# Patient Record
Sex: Male | Born: 1945
Health system: Southern US, Community
[De-identification: ages and names within clinical notes are randomized; demographics above are authoritative.]

## PROBLEM LIST (undated history)

## (undated) DIAGNOSIS — C801 Malignant (primary) neoplasm, unspecified: Secondary | ICD-10-CM

## (undated) DIAGNOSIS — H409 Unspecified glaucoma: Secondary | ICD-10-CM

## (undated) DIAGNOSIS — L720 Epidermal cyst: Secondary | ICD-10-CM

## (undated) DIAGNOSIS — K219 Gastro-esophageal reflux disease without esophagitis: Secondary | ICD-10-CM

## (undated) HISTORY — PX: APPENDECTOMY: SHX54

## (undated) HISTORY — PX: OTHER SURGICAL HISTORY: SHX169

## (undated) HISTORY — PX: CYSTECTOMY: SUR359

## (undated) HISTORY — PX: CATARACT EXTRACTION: SUR2

---

## 2008-06-10 DIAGNOSIS — J302 Other seasonal allergic rhinitis: Secondary | ICD-10-CM | POA: Insufficient documentation

## 2008-06-10 DIAGNOSIS — Z77090 Contact with and (suspected) exposure to asbestos: Secondary | ICD-10-CM | POA: Insufficient documentation

## 2008-06-10 DIAGNOSIS — D219 Benign neoplasm of connective and other soft tissue, unspecified: Secondary | ICD-10-CM | POA: Insufficient documentation

## 2008-06-10 DIAGNOSIS — E7800 Pure hypercholesterolemia, unspecified: Secondary | ICD-10-CM | POA: Insufficient documentation

## 2008-06-10 DIAGNOSIS — Z91030 Bee allergy status: Secondary | ICD-10-CM | POA: Insufficient documentation

## 2008-06-10 DIAGNOSIS — M1990 Unspecified osteoarthritis, unspecified site: Secondary | ICD-10-CM | POA: Insufficient documentation

## 2011-07-26 ENCOUNTER — Other Ambulatory Visit: Payer: Self-pay | Admitting: Internal Medicine

## 2011-07-26 DIAGNOSIS — R109 Unspecified abdominal pain: Secondary | ICD-10-CM

## 2011-08-02 ENCOUNTER — Ambulatory Visit
Admission: RE | Admit: 2011-08-02 | Discharge: 2011-08-02 | Disposition: A | Discharge status: Home / Self Care | Payer: Managed Care, Other (non HMO) | Source: Ambulatory Visit | Attending: Internal Medicine | Admitting: Internal Medicine

## 2011-08-02 DIAGNOSIS — R109 Unspecified abdominal pain: Secondary | ICD-10-CM

## 2011-08-02 MED ORDER — IOHEXOL 300 MG/ML  SOLN
100.0000 mL | Freq: Once | INTRAMUSCULAR | Status: AC | PRN
  Administered 2011-08-02: 100 mL via INTRAVENOUS

## 2011-09-09 ENCOUNTER — Ambulatory Visit (INDEPENDENT_AMBULATORY_CARE_PROVIDER_SITE_OTHER): Payer: Managed Care, Other (non HMO) | Admitting: Surgery

## 2011-10-06 ENCOUNTER — Ambulatory Visit (INDEPENDENT_AMBULATORY_CARE_PROVIDER_SITE_OTHER): Payer: Managed Care, Other (non HMO) | Admitting: Surgery

## 2011-10-18 ENCOUNTER — Ambulatory Visit (INDEPENDENT_AMBULATORY_CARE_PROVIDER_SITE_OTHER): Payer: Managed Care, Other (non HMO) | Admitting: Surgery

## 2011-10-18 ENCOUNTER — Encounter (INDEPENDENT_AMBULATORY_CARE_PROVIDER_SITE_OTHER): Payer: Self-pay | Admitting: Surgery

## 2011-10-18 VITALS — BP 118/82 | HR 72 | Temp 98.0°F | Resp 16 | Ht 75.0 in | Wt 235.0 lb

## 2011-10-18 DIAGNOSIS — K4090 Unilateral inguinal hernia, without obstruction or gangrene, not specified as recurrent: Secondary | ICD-10-CM

## 2011-10-18 NOTE — Progress Notes (Signed)
General Surgery [facility]  Chief Complaint  Patient presents with  . New Evaluation    eval hernia - referral from Dr. Kirby Funk    HISTORY: Patient is a 66 year old black male referred by his primary care physician for evaluation and management of a left inguinal hernia. Patient has had intermittent left groin pain for approximately one year. This is aggravated by physical activity. He has never seen a bulge. His only prior abdominal surgery with perforated appendicitis is a 27 year old child. Patient underwent evaluation by his primary care physician. This included a CT scan of the abdomen and pelvis performed in July 2013. This showed a small left inguinal hernia with fatty tissue and soft tissue density within the inguinal canal with mild inflammatory changes. There was no bowel within the hernia sac. Patient is now referred for consideration for left inguinal hernia repair for persistent intermittent left inguinal pain.  No past medical history on file.   Current Outpatient Prescriptions  Medication Sig Dispense Refill  . Ascorbic Acid (VITAMIN C) 1000 MG tablet Take 1,000 mg by mouth daily.      Marland Kitchen aspirin 81 MG tablet Take 81 mg by mouth daily.      . Multiple Vitamins-Minerals (MULTIVITAMIN WITH MINERALS) tablet Take 1 tablet by mouth daily.         No Known Allergies   No family history on file.   History   Social History  . Marital Status: Married    Spouse Name: N/A    Number of Children: N/A  . Years of Education: N/A   Social History Main Topics  . Smoking status: Never Smoker   . Smokeless tobacco: None  . Alcohol Use: Yes  . Drug Use: No  . Sexually Active:    Other Topics Concern  . None   Social History Narrative  . None     REVIEW OF SYSTEMS - PERTINENT POSITIVES ONLY: Denies signs or symptoms of obstruction. No history of bulge. Intermittent left inguinal pain exacerbated by physical activity.  EXAM: Filed Vitals:   10/18/11 1324  BP: 118/82  Pulse: 72  Temp: 98 F (36.7 C)  Resp: 16    HEENT: normocephalic; pupils equal and reactive; sclerae clear; dentition good; mucous membranes moist NECK:  symmetric on extension; no palpable anterior or posterior cervical lymphadenopathy; no supraclavicular masses; no tenderness CHEST: clear to auscultation bilaterally without rales, rhonchi, or wheezes CARDIAC: regular rate and rhythm without significant murmur; peripheral pulses are full ABDOMEN: soft without distension; bowel sounds present; no mass; no hepatosplenomegaly; palpation in the right inguinal canal shows no evidence of hernia. Palpation in the left inguinal canal shows some soft tissue density at the external inguinal ring. This does not augment with cough or Valsalva. There is mild tenderness. In the right lower quadrant of the abdominal wall is a complex well-healed surgical wound. EXT:  non-tender without edema; no deformity NEURO: no gross focal deficits; no sign of tremor   LABORATORY RESULTS: See [facility] (CHL-Epic) for most recent results   RADIOLOGY RESULTS: See [facility] (CHL-Epic) for most recent results   IMPRESSION: #1 left inguinal hernia, documented by CT scan, symptomatic #2 personal history of prostate cancer, treated with radiation therapy #3 history of perforated appendicitis  PLAN: I discussed the above findings at length with the patient and his wife. I have provided them with written literature to review on open inguinal hernia repair with mesh. We have discussed the risk and benefits of  the procedure. We discussed the restrictions on his activities following surgery. We have discussed potential complications including infection and recurrence. They understand and would like to proceed in November of 2013.  The risks and benefits of the procedure have been discussed at length with the patient.  The patient understands the proposed procedure, potential  alternative treatments, and the course of recovery to be expected.  All of the patient's questions have been answered at this time.  The patient wishes to proceed with surgery.  Velora Heckler, MD, FACS General & Endocrine Surgery [facility]   Visit Diagnoses: 1. Inguinal hernia unilateral, non-recurrent, left     Primary Care Physician: Lillia Mountain, MD

## 2011-10-18 NOTE — Patient Instructions (Signed)
[facility]  HERNIA REPAIR POST OP INSTRUCTIONS  Always review your discharge instruction sheet given to you by the facility where your surgery was performed.  1. A  prescription for pain medication may be given to you upon discharge.  Take your pain medication as prescribed.  If narcotic pain medicine is not needed, then you may take acetaminophen (Tylenol) or ibuprofen (Advil) as needed. 2. Take your usually prescribed medications unless otherwise directed. 3. If you need a refill on your pain medication, please contact your pharmacy.  They will contact our office to request authorization. Prescriptions will not be filled after 5 pm daily or on weekends. 4. You should follow a light diet the first 24 hours after arrival home, such as soup and crackers or toast.  Be sure to include plenty of fluids daily.  Resume your normal diet the day after surgery. 5. Most patients will experience some swelling and bruising around the surgical site.  Ice packs and reclining will help.  Swelling and bruising can take several days to resolve.  6. It is common to experience some constipation if taking pain medication after surgery.  Increasing fluid intake and taking a stool softener (such as Colace) will usually help or prevent this problem from occurring.  A mild laxative (Milk of Magnesia or Miralax) should be taken according to package directions if there are no bowel movements after 48 hours. 7. Unless discharge instructions indicate otherwise, you may remove your bandages 24-48 hours after surgery, and you may shower at that time.  You may have steri-strips (small skin tapes) in place directly over the incision.  These strips should be left on the skin for 7-10 days.  If your surgeon used skin glue on the incision, you may shower in 24 hours.  The glue will flake off over the next 2-3 weeks.  Any sutures or staples will be removed at the office during your follow-up visit. 8. ACTIVITIES:  You  may resume regular (light) daily activities beginning the next day-such as daily self-care, walking, climbing stairs-gradually increasing activities as tolerated.  You may have sexual intercourse when it is comfortable.  Refrain from any heavy lifting or straining until approved by your doctor.  You may drive when you are no longer taking prescription pain medication, you can comfortably wear a seatbelt, and you can safely maneuver your car and apply brakes. 9. You should see your doctor in the office for a follow-up appointment approximately 2-3 weeks after your surgery.  Make sure that you call for this appointment within a day or two after you arrive home to insure a convenient appointment time.  WHEN TO CALL YOUR DOCTOR: 1. Fever greater than 101.0 2. Inability to urinate 3. Persistent nausea and/or vomiting 4. Extreme swelling or bruising 5. Continued bleeding from incision 6. Increased pain, redness, or drainage from the incision  The clinic staff is available to answer your questions during regular business hours.  Please don't hesitate to call and ask to speak to one of the nurses for clinical concerns.  If you have a medical emergency, go to the nearest emergency room or call 911.  A surgeon from [facility] is always on call for the hospital.   [facility] [address], [address], [place], [place]  [address]  [phone] ? [phone] ? FAX [phone] [email or web address]   

## 2011-11-25 ENCOUNTER — Encounter (HOSPITAL_COMMUNITY): Payer: Self-pay | Admitting: Pharmacy Technician

## 2011-11-30 ENCOUNTER — Other Ambulatory Visit (HOSPITAL_COMMUNITY): Payer: Managed Care, Other (non HMO)

## 2011-12-01 ENCOUNTER — Encounter (HOSPITAL_COMMUNITY): Payer: Self-pay

## 2011-12-01 ENCOUNTER — Encounter (HOSPITAL_COMMUNITY)
Admission: RE | Admit: 2011-12-01 | Discharge: 2011-12-01 | Disposition: A | Discharge status: Home / Self Care | Payer: Managed Care, Other (non HMO) | Source: Ambulatory Visit | Attending: Surgery | Admitting: Surgery

## 2011-12-01 DIAGNOSIS — C801 Malignant (primary) neoplasm, unspecified: Secondary | ICD-10-CM

## 2011-12-01 HISTORY — DX: Malignant (primary) neoplasm, unspecified: C80.1

## 2011-12-01 HISTORY — DX: Gastro-esophageal reflux disease without esophagitis: K21.9

## 2011-12-01 LAB — SURGICAL PCR SCREEN: MRSA, PCR: NEGATIVE

## 2011-12-01 NOTE — Patient Instructions (Signed)
[address]  12/01/2011   Your procedure is scheduled on: 11-11  -2013  Report to [facility] at    0530    AM  Call this number if you have problems the morning of surgery: [phone]  Or Presurgical Testing [phone](Cathlene Gardella)   Remember:   Do not eat food:After Midnight.    Take these medicines the morning of surgery with A SIP OF WATER: none    Do not wear jewelry, make-up or nail polish.  Do not wear lotions, powders, or perfumes. You may wear deodorant.  Do not shave 48 hours prior to surgery.(face and neck okay, no shaving of legs)  Do not bring valuables to the hospital.  Contacts, dentures or bridgework may not be worn into surgery.  Leave suitcase in the car. After surgery it may be brought to your room.  For patients admitted to the hospital, checkout time is 11:00 AM the day of discharge.   Patients discharged the day of surgery will not be allowed to drive home. Must have responsible person with you x 24 hours once discharged.  Name and phone number of your driver: Rajdeep Abbs, spouse [phone] cell  Special Instructions: CHG Shower Use Special Wash: see special instruction sheet..(avoid face and genitals)   Please read over the following fact sheets that you were given: MRSA Information.

## 2011-12-06 ENCOUNTER — Encounter (HOSPITAL_COMMUNITY): Payer: Self-pay | Admitting: Surgery

## 2011-12-06 ENCOUNTER — Ambulatory Visit (HOSPITAL_COMMUNITY)
Admission: RE | Admit: 2011-12-06 | Discharge: 2011-12-06 | Disposition: A | Discharge status: Home / Self Care | Payer: Managed Care, Other (non HMO) | Source: Ambulatory Visit | Attending: Surgery | Admitting: Surgery

## 2011-12-06 ENCOUNTER — Encounter (HOSPITAL_COMMUNITY)
Admission: RE | Disposition: A | Discharge status: Home / Self Care | Payer: Self-pay | Source: Ambulatory Visit | Attending: Surgery

## 2011-12-06 ENCOUNTER — Encounter (HOSPITAL_COMMUNITY): Payer: Self-pay | Admitting: Anesthesiology

## 2011-12-06 ENCOUNTER — Encounter (HOSPITAL_COMMUNITY): Payer: Self-pay

## 2011-12-06 ENCOUNTER — Ambulatory Visit (HOSPITAL_COMMUNITY): Payer: Managed Care, Other (non HMO) | Admitting: Anesthesiology

## 2011-12-06 DIAGNOSIS — Z859 Personal history of malignant neoplasm, unspecified: Secondary | ICD-10-CM | POA: Insufficient documentation

## 2011-12-06 DIAGNOSIS — K4090 Unilateral inguinal hernia, without obstruction or gangrene, not specified as recurrent: Secondary | ICD-10-CM | POA: Diagnosis present

## 2011-12-06 DIAGNOSIS — Z01812 Encounter for preprocedural laboratory examination: Secondary | ICD-10-CM | POA: Insufficient documentation

## 2011-12-06 DIAGNOSIS — Z923 Personal history of irradiation: Secondary | ICD-10-CM | POA: Insufficient documentation

## 2011-12-06 DIAGNOSIS — K219 Gastro-esophageal reflux disease without esophagitis: Secondary | ICD-10-CM | POA: Insufficient documentation

## 2011-12-06 DIAGNOSIS — Z7982 Long term (current) use of aspirin: Secondary | ICD-10-CM | POA: Insufficient documentation

## 2011-12-06 HISTORY — PX: INGUINAL HERNIA REPAIR: SHX194

## 2011-12-06 HISTORY — PX: INSERTION OF MESH: SHX5868

## 2011-12-06 HISTORY — PX: HERNIA REPAIR: SHX51

## 2011-12-06 SURGERY — REPAIR, HERNIA, INGUINAL, ADULT
Anesthesia: General | Site: Groin | Laterality: Left | Wound class: Clean

## 2011-12-06 MED ORDER — OXYCODONE HCL 5 MG PO TABS: 5.0000 mg | ORAL_TABLET | Freq: Once | ORAL | Status: DC | PRN

## 2011-12-06 MED ORDER — LIDOCAINE HCL (CARDIAC) 20 MG/ML IV SOLN
INTRAVENOUS | Status: DC | PRN
  Administered 2011-12-06: 100 mg via INTRAVENOUS

## 2011-12-06 MED ORDER — OXYCODONE HCL 5 MG/5ML PO SOLN
5.0000 mg | Freq: Once | ORAL | Status: DC | PRN
  Filled 2011-12-06: qty 5

## 2011-12-06 MED ORDER — ACETAMINOPHEN 10 MG/ML IV SOLN
INTRAVENOUS | Status: DC | PRN
  Administered 2011-12-06: 1000 mg via INTRAVENOUS

## 2011-12-06 MED ORDER — NEOSTIGMINE METHYLSULFATE 1 MG/ML IJ SOLN
INTRAMUSCULAR | Status: DC | PRN
  Administered 2011-12-06: 5 mg via INTRAVENOUS

## 2011-12-06 MED ORDER — MIDAZOLAM HCL 5 MG/5ML IJ SOLN
INTRAMUSCULAR | Status: DC | PRN
  Administered 2011-12-06: 2 mg via INTRAVENOUS

## 2011-12-06 MED ORDER — MEPERIDINE HCL 50 MG/ML IJ SOLN: 6.2500 mg | INTRAMUSCULAR | Status: DC | PRN

## 2011-12-06 MED ORDER — PROMETHAZINE HCL 25 MG/ML IJ SOLN: 6.2500 mg | INTRAMUSCULAR | Status: DC | PRN

## 2011-12-06 MED ORDER — HYDROCODONE-ACETAMINOPHEN 5-325 MG PO TABS
1.0000 | ORAL_TABLET | ORAL | Status: DC | PRN
  Administered 2011-12-06: 1 via ORAL
  Filled 2011-12-06: qty 1

## 2011-12-06 MED ORDER — HYDROMORPHONE HCL PF 1 MG/ML IJ SOLN
INTRAMUSCULAR | Status: AC
  Filled 2011-12-06: qty 1

## 2011-12-06 MED ORDER — ACETAMINOPHEN 10 MG/ML IV SOLN
INTRAVENOUS | Status: AC
  Filled 2011-12-06: qty 100

## 2011-12-06 MED ORDER — BUPIVACAINE HCL 0.5 % IJ SOLN
INTRAMUSCULAR | Status: AC
  Filled 2011-12-06: qty 1

## 2011-12-06 MED ORDER — CEFAZOLIN SODIUM-DEXTROSE 2-3 GM-% IV SOLR
2.0000 g | INTRAVENOUS | Status: AC
  Administered 2011-12-06: 2 g via INTRAVENOUS

## 2011-12-06 MED ORDER — LACTATED RINGERS IV SOLN
INTRAVENOUS | Status: DC | PRN
  Administered 2011-12-06 (×2): via INTRAVENOUS

## 2011-12-06 MED ORDER — DEXAMETHASONE SODIUM PHOSPHATE 10 MG/ML IJ SOLN
INTRAMUSCULAR | Status: DC | PRN
  Administered 2011-12-06: 10 mg via INTRAVENOUS

## 2011-12-06 MED ORDER — ACETAMINOPHEN 10 MG/ML IV SOLN: 1000.0000 mg | Freq: Once | INTRAVENOUS | Status: DC | PRN

## 2011-12-06 MED ORDER — GLYCOPYRROLATE 0.2 MG/ML IJ SOLN
INTRAMUSCULAR | Status: DC | PRN
  Administered 2011-12-06: .8 mg via INTRAVENOUS

## 2011-12-06 MED ORDER — ONDANSETRON HCL 4 MG/2ML IJ SOLN
INTRAMUSCULAR | Status: DC | PRN
  Administered 2011-12-06: 4 mg via INTRAVENOUS

## 2011-12-06 MED ORDER — CEFAZOLIN SODIUM-DEXTROSE 2-3 GM-% IV SOLR
INTRAVENOUS | Status: AC
  Filled 2011-12-06: qty 50

## 2011-12-06 MED ORDER — LACTATED RINGERS IV SOLN: INTRAVENOUS | Status: DC

## 2011-12-06 MED ORDER — FENTANYL CITRATE 0.05 MG/ML IJ SOLN
INTRAMUSCULAR | Status: DC | PRN
  Administered 2011-12-06: 50 ug via INTRAVENOUS
  Administered 2011-12-06: 150 ug via INTRAVENOUS

## 2011-12-06 MED ORDER — ROCURONIUM BROMIDE 100 MG/10ML IV SOLN
INTRAVENOUS | Status: DC | PRN
  Administered 2011-12-06: 50 mg via INTRAVENOUS

## 2011-12-06 MED ORDER — PROPOFOL 10 MG/ML IV BOLUS
INTRAVENOUS | Status: DC | PRN
  Administered 2011-12-06: 200 mg via INTRAVENOUS

## 2011-12-06 MED ORDER — BUPIVACAINE HCL (PF) 0.5 % IJ SOLN
INTRAMUSCULAR | Status: DC | PRN
  Administered 2011-12-06: 20 mL

## 2011-12-06 MED ORDER — HYDROMORPHONE HCL PF 1 MG/ML IJ SOLN
0.2500 mg | INTRAMUSCULAR | Status: DC | PRN
  Administered 2011-12-06: 0.5 mg via INTRAVENOUS

## 2011-12-06 MED ORDER — KETOROLAC TROMETHAMINE 30 MG/ML IJ SOLN
INTRAMUSCULAR | Status: DC | PRN
  Administered 2011-12-06: 30 mg via INTRAVENOUS

## 2011-12-06 SURGICAL SUPPLY — 35 items
APPLICATOR COTTON TIP 6IN STRL (MISCELLANEOUS) ×2 IMPLANT
BENZOIN TINCTURE PRP APPL 2/3 (GAUZE/BANDAGES/DRESSINGS) ×2 IMPLANT
BLADE HEX COATED 2.75 (ELECTRODE) ×2 IMPLANT
BLADE SURG 15 STRL LF DISP TIS (BLADE) ×1 IMPLANT
BLADE SURG 15 STRL SS (BLADE) ×1
BLADE SURG SZ10 CARB STEEL (BLADE) ×2 IMPLANT
CANISTER SUCTION 2500CC (MISCELLANEOUS) ×2 IMPLANT
CLOTH BEACON ORANGE TIMEOUT ST (SAFETY) ×2 IMPLANT
DECANTER SPIKE VIAL GLASS SM (MISCELLANEOUS) ×2 IMPLANT
DRAIN PENROSE 18X1/2 LTX STRL (DRAIN) ×2 IMPLANT
DRAPE LAPAROTOMY TRNSV 102X78 (DRAPE) ×2 IMPLANT
ELECT REM PT RETURN 9FT ADLT (ELECTROSURGICAL) ×2
ELECTRODE REM PT RTRN 9FT ADLT (ELECTROSURGICAL) ×1 IMPLANT
GLOVE BIOGEL PI IND STRL 7.0 (GLOVE) ×1 IMPLANT
GLOVE BIOGEL PI INDICATOR 7.0 (GLOVE) ×1
GLOVE SURG ORTHO 8.0 STRL STRW (GLOVE) ×2 IMPLANT
GOWN STRL NON-REIN LRG LVL3 (GOWN DISPOSABLE) ×4 IMPLANT
GOWN STRL REIN XL XLG (GOWN DISPOSABLE) ×4 IMPLANT
KIT BASIN OR (CUSTOM PROCEDURE TRAY) ×2 IMPLANT
MESH ULTRAPRO 3X6 7.6X15CM (Mesh General) ×2 IMPLANT
NEEDLE HYPO 25X1 1.5 SAFETY (NEEDLE) ×2 IMPLANT
NS IRRIG 1000ML POUR BTL (IV SOLUTION) ×2 IMPLANT
PACK BASIC VI WITH GOWN DISP (CUSTOM PROCEDURE TRAY) ×2 IMPLANT
PENCIL BUTTON HOLSTER BLD 10FT (ELECTRODE) ×2 IMPLANT
SPONGE GAUZE 4X4 12PLY (GAUZE/BANDAGES/DRESSINGS) ×2 IMPLANT
SPONGE LAP 4X18 X RAY DECT (DISPOSABLE) ×6 IMPLANT
STRIP CLOSURE SKIN 1/2X4 (GAUZE/BANDAGES/DRESSINGS) ×2 IMPLANT
SUT MNCRL AB 4-0 PS2 18 (SUTURE) ×2 IMPLANT
SUT NOVA NAB GS-22 2 0 T19 (SUTURE) ×4 IMPLANT
SUT SILK 2 0 SH (SUTURE) ×2 IMPLANT
SUT VIC AB 3-0 SH 18 (SUTURE) ×2 IMPLANT
SYR BULB IRRIGATION 50ML (SYRINGE) ×2 IMPLANT
SYR CONTROL 10ML LL (SYRINGE) ×2 IMPLANT
TOWEL OR 17X26 10 PK STRL BLUE (TOWEL DISPOSABLE) ×2 IMPLANT
YANKAUER SUCT BULB TIP 10FT TU (MISCELLANEOUS) ×2 IMPLANT

## 2011-12-06 NOTE — Op Note (Signed)
Inguinal Hernia, Open, Procedure Note  Pre-operative Diagnosis:  Left inguinal hernia, reducible  Post-operative Diagnosis: same  Surgeon:  Velora Heckler, MD, FACS  Anesthesia:  General  Indications: The patient presented with a left, reducible hernia.    Procedure Details  The patient was seen again in the Holding Room. The risks, benefits, complications, treatment options, and expected outcomes were discussed with the patient.  There was concurrence with the proposed plan, and informed consent was obtained. The site of surgery was properly noted/marked. The patient was taken to the Operating Room, identified by name, and the procedure verified as hernia repair. A Time Out was held and the above information confirmed.  The patient was placed in the supine position and underwent induction of anesthesia.  The lower abdomen and groin was prepped and draped in the usual strict aseptic fashion.  After ascertaining that an adequate level of anesthesia had been obtained, and incision is made in the groin with a #10 blade.  Dissection is carried through the subcutaneous tissues and hemostasis obtained with the electrocautery.  A Gelpi retractor is placed for exposure.  The external oblique fascia is incised in line with it's fibers and extended through the external inguinal ring.  The cord structures are dissected out of the inguinal canal and encircled with a Penrose drain.  The floor of the inguinal canal is dissected out.  The cord is explored and a small indirect sac is identified.  It is dissected out up to the level of the internal inguinal ring.  A high ligation of the sac is performed with a 2-0 Vicryl suture and the sac excised and discarded.  The floor of the inguinal canal is reconstructed with a sheet of mesh cut to the appropriate dimensions.  It is secured to the pubic tubercle with a 2-0 Novafil suture and along the inguinal ligament with a running 2-0 Novafil suture.  Mesh is split to  accommodate the cord structures.  The superior edge of the mesh is secured to the transversalis and internal oblique muscles with interrupted 2-0 Novafil sutures.  The tails of the mesh are overlapped lateral to the cord structures and secured to the inguinal ligament with interrupted 2-0 Novafil sutures to recreate the internal inguinal ring.  Cord structures are returned to the inguinal canal.  Local anesthetic is infiltrated throughout the field.  External oblique fascia is closed with interrupted 3-0 Vicryl sutures.  Subcutaneous tissues are closed with interrupted 3-0 Vicryl sutures.  Skin is anesthetized with local anesthetic, and the skin edges re-approximated with a running 4-0 Monocryl suture.  Wound is washed and dried and benzoin and steristrips are applied.  A gauze dressing is then applied.  Instrument, sponge, and needle counts were correct prior to closure and at the conclusion of the case.  Findings: Hernia as above  Estimated Blood Loss: Minimal         Specimens: None  Complications: None; patient tolerated the procedure well.         Disposition: PACU - hemodynamically stable.         Condition: stable   Velora Heckler, MD, FACS General & Endocrine Surgery [facility]

## 2011-12-06 NOTE — Transfer of Care (Signed)
Immediate Anesthesia Transfer of Care Note  Patient: Brandon Dyer  Procedure(s) Performed: Procedure(s) (LRB) with comments: HERNIA REPAIR INGUINAL ADULT (Left) - Repair Left Inguinal Hernia with Mesh INSERTION OF MESH (Left)  Patient Location: PACU  Anesthesia Type:General  Level of Consciousness: awake, alert  and oriented  Airway & Oxygen Therapy: Patient Spontanous Breathing and Patient connected to face mask oxygen  Post-op Assessment: Report given to PACU RN  Post vital signs: Reviewed  Complications: No apparent anesthesia complications

## 2011-12-06 NOTE — Anesthesia Postprocedure Evaluation (Signed)
Anesthesia Post Note  Patient: Brandon Dyer  Procedure(s) Performed: Procedure(s) (LRB): HERNIA REPAIR INGUINAL ADULT (Left) INSERTION OF MESH (Left)  Anesthesia type: General  Patient location: PACU  Post pain: Pain level controlled  Post assessment: Post-op Vital signs reviewed  Last Vitals: BP 135/89  Pulse 59  Temp 36.6 C (Oral)  Resp 14  SpO2 95%  Post vital signs: Reviewed  Level of consciousness: sedated  Complications: No apparent anesthesia complications

## 2011-12-06 NOTE — H&P (Signed)
Brandon Dyer is an 66 y.o. male.    General Surgery [facility]  Chief Complaint: LIH  HPI: Patient is a 66 year old black male referred by his primary care physician for evaluation and management of a left inguinal hernia. Patient has had intermittent left groin pain for approximately one year. This is aggravated by physical activity. He has never seen a bulge. His only prior abdominal surgery with perforated appendicitis is a 25 year old child. Patient underwent evaluation by his primary care physician. This included a CT scan of the abdomen and pelvis performed in July 2013. This showed a small left inguinal hernia with fatty tissue and soft tissue density within the inguinal canal with mild inflammatory changes. There was no bowel within the hernia sac. Patient is now referred for consideration for left inguinal hernia repair for persistent intermittent left inguinal pain.    Past Medical History  Diagnosis Date  . Cancer 12-01-11    dx. 2008-Radiation tx.  Marland Kitchen GERD (gastroesophageal reflux disease)     occ. -uses OTC med-PRILOSEC    Past Surgical History  Procedure Date  . Appendectomy   . Cataract extraction   . Cataract surgery     one eye    History reviewed. No pertinent family history. Social History:  reports that he has never smoked. He does not have any smokeless tobacco history on file. He reports that he drinks alcohol. He reports that he does not use illicit drugs.  Allergies: No Known Allergies  Medications Prior to Admission  Medication Sig Dispense Refill  . Ascorbic Acid (VITAMIN C) 1000 MG tablet Take 1,000 mg by mouth daily.      Marland Kitchen aspirin 81 MG tablet Take 81 mg by mouth daily.      . Multiple Vitamins-Minerals (ICAPS PO) Take 1 capsule by mouth daily.      . Multiple Vitamins-Minerals (MULTIVITAMIN WITH MINERALS) tablet Take 1 tablet by mouth daily.      Marland Kitchen VIAGRA 50 MG tablet Take 50 mg by mouth as needed. For erectile dysfunction        . VITAMIN E PO Take 1 tablet by mouth daily.        No results found for this or any previous visit (from the past 48 hour(s)). No results found.  Review of Systems  Constitutional: Negative.   HENT: Negative.   Eyes: Negative.   Respiratory: Negative.   Cardiovascular: Negative.   Gastrointestinal: Negative.   Genitourinary: Negative.   Musculoskeletal: Negative.   Skin: Negative.   Neurological: Negative.   Endo/Heme/Allergies: Negative.   Psychiatric/Behavioral: Negative.     Blood pressure 128/87, pulse 64, temperature 97.8 F (36.6 C), temperature source Oral, resp. rate 18, SpO2 98.00%. Physical Exam  Constitutional: He is oriented to person, place, and time. He appears well-developed and well-nourished. No distress.  HENT:  Head: Normocephalic and atraumatic.  Right Ear: External ear normal.  Left Ear: External ear normal.  Mouth/Throat: Oropharynx is clear and moist.  Eyes: Conjunctivae normal and EOM are normal. Pupils are equal, round, and reactive to light. No scleral icterus.  Neck: Normal range of motion. Neck supple. No tracheal deviation present. No thyromegaly present.  Cardiovascular: Normal rate, regular rhythm and normal heart sounds.   No murmur heard. Respiratory: Effort normal and breath sounds normal. He has no wheezes.  GI: Soft. Bowel sounds are normal. He exhibits no distension. There is no tenderness.  Genitourinary:       Small LIH, spontaneously reducible  Musculoskeletal:  Normal range of motion.  Neurological: He is alert and oriented to person, place, and time.  Skin: Skin is warm and dry.  Psychiatric: He has a normal mood and affect. His behavior is normal.     Assessment/Plan 1.  LIH, reducible  Plan repair today  The risks and benefits of the procedure have been discussed at length with the patient.  The patient understands the proposed procedure, potential alternative treatments, and the course of recovery to be expected.  All of  the patient's questions have been answered at this time.  The patient wishes to proceed with surgery.  Velora Heckler, MD, [facility] Office: [phone]    Jarryd Gratz M 12/06/2011, 7:21 AM

## 2011-12-06 NOTE — Preoperative (Signed)
Beta Blockers   Reason not to administer Beta Blockers:Not Applicable 

## 2011-12-06 NOTE — Anesthesia Preprocedure Evaluation (Addendum)
Anesthesia Evaluation  Patient identified by MRN, date of birth, ID band Patient awake    Reviewed: Allergy & Precautions, H&P , Patient's Chart, lab work & pertinent test results  Airway Mallampati: II TM Distance: >3 FB Neck ROM: Full    Dental  (+) Teeth Intact and Dental Advisory Given   Pulmonary neg pulmonary ROS,  breath sounds clear to auscultation  Pulmonary exam normal       Cardiovascular - angina- CAD and - Past MI Rhythm:Regular Rate:Normal     Neuro/Psych negative neurological ROS     GI/Hepatic Neg liver ROS, GERD-  Controlled,  Endo/Other  negative endocrine ROS  Renal/GU negative Renal ROS     Musculoskeletal negative musculoskeletal ROS (+)   Abdominal (+) - obese,   Peds  Hematology negative hematology ROS (+)   Anesthesia Other Findings   Reproductive/Obstetrics                         Anesthesia Physical Anesthesia Plan  ASA: II  Anesthesia Plan: General   Post-op Pain Management:    Induction: Intravenous  Airway Management Planned: Oral ETT  Additional Equipment:   Intra-op Plan:   Post-operative Plan: Extubation in OR  Informed Consent: I have reviewed the patients History and Physical, chart, labs and discussed the procedure including the risks, benefits and alternatives for the proposed anesthesia with the patient or authorized representative who has indicated his/her understanding and acceptance.   Dental advisory given  Plan Discussed with: CRNA  Anesthesia Plan Comments:        Anesthesia Quick Evaluation

## 2011-12-07 ENCOUNTER — Encounter (HOSPITAL_COMMUNITY): Payer: Self-pay | Admitting: Surgery

## 2011-12-20 ENCOUNTER — Encounter (INDEPENDENT_AMBULATORY_CARE_PROVIDER_SITE_OTHER): Payer: Self-pay | Admitting: Surgery

## 2011-12-20 ENCOUNTER — Ambulatory Visit (INDEPENDENT_AMBULATORY_CARE_PROVIDER_SITE_OTHER): Payer: Managed Care, Other (non HMO) | Admitting: Surgery

## 2011-12-20 VITALS — BP 100/68 | HR 149 | Temp 97.8°F | Ht 75.0 in | Wt 241.6 lb

## 2011-12-20 DIAGNOSIS — K4090 Unilateral inguinal hernia, without obstruction or gangrene, not specified as recurrent: Secondary | ICD-10-CM

## 2011-12-20 NOTE — Progress Notes (Signed)
General Surgery [facility]  Visit Diagnoses: 1. Inguinal hernia unilateral, non-recurrent, left     HISTORY: Patient is a 66 year old black male who underwent left inguinal hernia repair with mesh on 12/06/2011. Postoperative course has been uneventful. He has no complaints.  However on presentation in the office today for his postoperative visit, he is noted to be tachycardic with a heart rate of approximately 150 beats per minute. Patient does complain of being slightly lightheaded this morning.  EXAM: Surgical wound is well-healed. Mild soft tissue swelling. No sign of infection. With cough and Valsalva there is no sign of recurrence.  Auscultation of the chest shows the lung fields to be clear. Cardiac exam shows a significant tachycardia with a regular rhythm and no significant murmur  IMPRESSION: #1 status post left inguinal hernia repair with mesh, doing well #2 tachycardia, mildly symptomatic, suspect atrial flutter  PLAN: Patient will return for followup wound check in 6-8 weeks. Instructions are given for wound care and for restrictions on activities.  We will contact the patient's primary care physician and make arrangements for evaluation today to include an EKG.  Velora Heckler, MD, FACS General & Endocrine Surgery [facility]

## 2011-12-20 NOTE — Patient Instructions (Signed)
  COCOA BUTTER & VITAMIN E CREAM  (Palmer's or other brand)  Apply cocoa butter/vitamin E cream to your incision 2 - 3 times daily.  Massage cream into incision for one minute with each application.  Use sunscreen (50 SPF or higher) for first 6 months after surgery if area is exposed to sun.  You may substitute Mederma or other scar reducing creams as desired.   

## 2012-02-07 ENCOUNTER — Encounter (INDEPENDENT_AMBULATORY_CARE_PROVIDER_SITE_OTHER): Payer: Self-pay | Admitting: Surgery

## 2012-02-07 ENCOUNTER — Ambulatory Visit (INDEPENDENT_AMBULATORY_CARE_PROVIDER_SITE_OTHER): Payer: Managed Care, Other (non HMO) | Admitting: Surgery

## 2012-02-07 VITALS — BP 118/76 | HR 64 | Temp 97.0°F | Resp 16 | Ht 75.0 in | Wt 241.8 lb

## 2012-02-07 DIAGNOSIS — K4090 Unilateral inguinal hernia, without obstruction or gangrene, not specified as recurrent: Secondary | ICD-10-CM

## 2012-02-07 NOTE — Progress Notes (Signed)
General Surgery [facility]  Visit Diagnoses: 1. Inguinal hernia unilateral, non-recurrent, left     HISTORY: Patient returns for final postoperative visit having undergone left inguinal hernia repair with mesh in November 2013. Patient denies any significant problems. He does have intermittent episodes of constipation.  EXAM: Left inguinal incision is well-healed. Minimal soft tissue swelling. Palpation in the inguinal canal with cough and Valsalva shows no sign of recurrence.  IMPRESSION: Status post left inguinal hernia repair with mesh  PLAN: The patient may resume normal activity without restriction. I have advised him to use MiraLAX on occasion for constipation. He will continue to apply topical creams to his incision. He will return to see me as needed.  Velora Heckler, MD, FACS General & Endocrine Surgery [facility]

## 2012-02-07 NOTE — Patient Instructions (Signed)
Miralax for constipation as needed.   COCOA BUTTER & VITAMIN E CREAM  (Palmer's or other brand)  Apply cocoa butter/vitamin E cream to your incision 2 - 3 times daily.  Massage cream into incision for one minute with each application.  Use sunscreen (50 SPF or higher) for first 6 months after surgery if area is exposed to sun.  You may substitute Mederma or other scar reducing creams as desired.

## 2012-07-03 DIAGNOSIS — H259 Unspecified age-related cataract: Secondary | ICD-10-CM | POA: Diagnosis not present

## 2012-08-07 DIAGNOSIS — Z23 Encounter for immunization: Secondary | ICD-10-CM | POA: Diagnosis not present

## 2012-08-07 DIAGNOSIS — Z1331 Encounter for screening for depression: Secondary | ICD-10-CM | POA: Diagnosis not present

## 2012-08-07 DIAGNOSIS — Z0000 Encounter for general adult medical examination without abnormal findings: Secondary | ICD-10-CM | POA: Diagnosis not present

## 2013-04-30 DIAGNOSIS — N529 Male erectile dysfunction, unspecified: Secondary | ICD-10-CM | POA: Diagnosis not present

## 2013-04-30 DIAGNOSIS — Z8546 Personal history of malignant neoplasm of prostate: Secondary | ICD-10-CM | POA: Diagnosis not present

## 2013-04-30 DIAGNOSIS — R351 Nocturia: Secondary | ICD-10-CM | POA: Diagnosis not present

## 2013-07-30 DIAGNOSIS — Z8546 Personal history of malignant neoplasm of prostate: Secondary | ICD-10-CM | POA: Diagnosis not present

## 2013-08-13 DIAGNOSIS — Z0000 Encounter for general adult medical examination without abnormal findings: Secondary | ICD-10-CM | POA: Diagnosis not present

## 2013-08-13 DIAGNOSIS — Z23 Encounter for immunization: Secondary | ICD-10-CM | POA: Diagnosis not present

## 2014-03-28 ENCOUNTER — Telehealth: Payer: Self-pay | Admitting: Internal Medicine

## 2014-03-28 NOTE — Telephone Encounter (Deleted)
Wrong pt's chart.

## 2014-03-28 NOTE — Telephone Encounter (Signed)
Pt called and would like a call back.

## 2014-04-01 NOTE — Telephone Encounter (Signed)
Brandon Dyer will you close this for me please .Marland Kitchen It want let me

## 2014-05-20 DIAGNOSIS — R351 Nocturia: Secondary | ICD-10-CM | POA: Diagnosis not present

## 2014-05-20 DIAGNOSIS — N5201 Erectile dysfunction due to arterial insufficiency: Secondary | ICD-10-CM | POA: Diagnosis not present

## 2014-05-20 DIAGNOSIS — Z8546 Personal history of malignant neoplasm of prostate: Secondary | ICD-10-CM | POA: Diagnosis not present

## 2014-08-19 DIAGNOSIS — Z1389 Encounter for screening for other disorder: Secondary | ICD-10-CM | POA: Diagnosis not present

## 2014-08-19 DIAGNOSIS — Z136 Encounter for screening for cardiovascular disorders: Secondary | ICD-10-CM | POA: Diagnosis not present

## 2014-08-19 DIAGNOSIS — Z0000 Encounter for general adult medical examination without abnormal findings: Secondary | ICD-10-CM | POA: Diagnosis not present

## 2014-08-19 DIAGNOSIS — Z131 Encounter for screening for diabetes mellitus: Secondary | ICD-10-CM | POA: Diagnosis not present

## 2015-05-05 ENCOUNTER — Ambulatory Visit
Admission: RE | Admit: 2015-05-05 | Discharge: 2015-05-05 | Disposition: A | Discharge status: Home / Self Care | Payer: Medicare Other | Source: Ambulatory Visit | Attending: Internal Medicine | Admitting: Internal Medicine

## 2015-05-05 ENCOUNTER — Other Ambulatory Visit: Payer: Self-pay | Admitting: Internal Medicine

## 2015-05-05 DIAGNOSIS — M25572 Pain in left ankle and joints of left foot: Secondary | ICD-10-CM

## 2015-05-05 DIAGNOSIS — G8929 Other chronic pain: Secondary | ICD-10-CM | POA: Diagnosis not present

## 2015-06-02 DIAGNOSIS — N5201 Erectile dysfunction due to arterial insufficiency: Secondary | ICD-10-CM | POA: Diagnosis not present

## 2015-06-02 DIAGNOSIS — Z8546 Personal history of malignant neoplasm of prostate: Secondary | ICD-10-CM | POA: Diagnosis not present

## 2015-06-02 DIAGNOSIS — R351 Nocturia: Secondary | ICD-10-CM | POA: Diagnosis not present

## 2015-08-11 DIAGNOSIS — Z8546 Personal history of malignant neoplasm of prostate: Secondary | ICD-10-CM | POA: Diagnosis not present

## 2015-08-11 DIAGNOSIS — Z1389 Encounter for screening for other disorder: Secondary | ICD-10-CM | POA: Diagnosis not present

## 2015-08-11 DIAGNOSIS — Z0000 Encounter for general adult medical examination without abnormal findings: Secondary | ICD-10-CM | POA: Diagnosis not present

## 2015-08-11 DIAGNOSIS — Z6830 Body mass index (BMI) 30.0-30.9, adult: Secondary | ICD-10-CM | POA: Diagnosis not present

## 2015-08-11 DIAGNOSIS — I480 Paroxysmal atrial fibrillation: Secondary | ICD-10-CM | POA: Diagnosis not present

## 2015-08-11 DIAGNOSIS — E663 Overweight: Secondary | ICD-10-CM | POA: Diagnosis not present

## 2015-11-03 DIAGNOSIS — Z1211 Encounter for screening for malignant neoplasm of colon: Secondary | ICD-10-CM | POA: Diagnosis not present

## 2015-11-19 DIAGNOSIS — Z23 Encounter for immunization: Secondary | ICD-10-CM | POA: Diagnosis not present

## 2015-11-25 DIAGNOSIS — M79672 Pain in left foot: Secondary | ICD-10-CM | POA: Diagnosis not present

## 2015-12-08 DIAGNOSIS — M79672 Pain in left foot: Secondary | ICD-10-CM | POA: Diagnosis not present

## 2015-12-08 DIAGNOSIS — G5752 Tarsal tunnel syndrome, left lower limb: Secondary | ICD-10-CM | POA: Diagnosis not present

## 2015-12-08 DIAGNOSIS — M722 Plantar fascial fibromatosis: Secondary | ICD-10-CM | POA: Diagnosis not present

## 2015-12-08 DIAGNOSIS — M76822 Posterior tibial tendinitis, left leg: Secondary | ICD-10-CM | POA: Diagnosis not present

## 2015-12-15 DIAGNOSIS — M722 Plantar fascial fibromatosis: Secondary | ICD-10-CM | POA: Diagnosis not present

## 2015-12-15 DIAGNOSIS — M79672 Pain in left foot: Secondary | ICD-10-CM | POA: Diagnosis not present

## 2015-12-15 DIAGNOSIS — G5752 Tarsal tunnel syndrome, left lower limb: Secondary | ICD-10-CM | POA: Diagnosis not present

## 2015-12-15 DIAGNOSIS — M76822 Posterior tibial tendinitis, left leg: Secondary | ICD-10-CM | POA: Diagnosis not present

## 2015-12-22 DIAGNOSIS — G5752 Tarsal tunnel syndrome, left lower limb: Secondary | ICD-10-CM | POA: Diagnosis not present

## 2015-12-22 DIAGNOSIS — M722 Plantar fascial fibromatosis: Secondary | ICD-10-CM | POA: Diagnosis not present

## 2015-12-22 DIAGNOSIS — M79672 Pain in left foot: Secondary | ICD-10-CM | POA: Diagnosis not present

## 2015-12-22 DIAGNOSIS — M76822 Posterior tibial tendinitis, left leg: Secondary | ICD-10-CM | POA: Diagnosis not present

## 2015-12-25 DIAGNOSIS — M722 Plantar fascial fibromatosis: Secondary | ICD-10-CM | POA: Diagnosis not present

## 2015-12-25 DIAGNOSIS — M76822 Posterior tibial tendinitis, left leg: Secondary | ICD-10-CM | POA: Diagnosis not present

## 2015-12-25 DIAGNOSIS — G5752 Tarsal tunnel syndrome, left lower limb: Secondary | ICD-10-CM | POA: Diagnosis not present

## 2015-12-25 DIAGNOSIS — M79672 Pain in left foot: Secondary | ICD-10-CM | POA: Diagnosis not present

## 2015-12-29 DIAGNOSIS — M76822 Posterior tibial tendinitis, left leg: Secondary | ICD-10-CM | POA: Diagnosis not present

## 2015-12-29 DIAGNOSIS — M79672 Pain in left foot: Secondary | ICD-10-CM | POA: Diagnosis not present

## 2015-12-29 DIAGNOSIS — G5752 Tarsal tunnel syndrome, left lower limb: Secondary | ICD-10-CM | POA: Diagnosis not present

## 2015-12-29 DIAGNOSIS — M722 Plantar fascial fibromatosis: Secondary | ICD-10-CM | POA: Diagnosis not present

## 2016-01-01 DIAGNOSIS — M76822 Posterior tibial tendinitis, left leg: Secondary | ICD-10-CM | POA: Diagnosis not present

## 2016-01-01 DIAGNOSIS — M722 Plantar fascial fibromatosis: Secondary | ICD-10-CM | POA: Diagnosis not present

## 2016-01-01 DIAGNOSIS — G5752 Tarsal tunnel syndrome, left lower limb: Secondary | ICD-10-CM | POA: Diagnosis not present

## 2016-01-01 DIAGNOSIS — M79672 Pain in left foot: Secondary | ICD-10-CM | POA: Diagnosis not present

## 2016-01-05 DIAGNOSIS — M76822 Posterior tibial tendinitis, left leg: Secondary | ICD-10-CM | POA: Diagnosis not present

## 2016-01-05 DIAGNOSIS — G5752 Tarsal tunnel syndrome, left lower limb: Secondary | ICD-10-CM | POA: Diagnosis not present

## 2016-01-05 DIAGNOSIS — M79672 Pain in left foot: Secondary | ICD-10-CM | POA: Diagnosis not present

## 2016-01-05 DIAGNOSIS — M722 Plantar fascial fibromatosis: Secondary | ICD-10-CM | POA: Diagnosis not present

## 2016-01-08 DIAGNOSIS — M76822 Posterior tibial tendinitis, left leg: Secondary | ICD-10-CM | POA: Diagnosis not present

## 2016-01-08 DIAGNOSIS — M79672 Pain in left foot: Secondary | ICD-10-CM | POA: Diagnosis not present

## 2016-01-08 DIAGNOSIS — M722 Plantar fascial fibromatosis: Secondary | ICD-10-CM | POA: Diagnosis not present

## 2016-01-08 DIAGNOSIS — G5752 Tarsal tunnel syndrome, left lower limb: Secondary | ICD-10-CM | POA: Diagnosis not present

## 2016-01-15 DIAGNOSIS — G5752 Tarsal tunnel syndrome, left lower limb: Secondary | ICD-10-CM | POA: Diagnosis not present

## 2016-01-15 DIAGNOSIS — M722 Plantar fascial fibromatosis: Secondary | ICD-10-CM | POA: Diagnosis not present

## 2016-01-15 DIAGNOSIS — M76822 Posterior tibial tendinitis, left leg: Secondary | ICD-10-CM | POA: Diagnosis not present

## 2016-01-15 DIAGNOSIS — M79672 Pain in left foot: Secondary | ICD-10-CM | POA: Diagnosis not present

## 2016-01-22 DIAGNOSIS — M722 Plantar fascial fibromatosis: Secondary | ICD-10-CM | POA: Diagnosis not present

## 2016-01-22 DIAGNOSIS — G5752 Tarsal tunnel syndrome, left lower limb: Secondary | ICD-10-CM | POA: Diagnosis not present

## 2016-01-22 DIAGNOSIS — M79672 Pain in left foot: Secondary | ICD-10-CM | POA: Diagnosis not present

## 2016-01-22 DIAGNOSIS — M76822 Posterior tibial tendinitis, left leg: Secondary | ICD-10-CM | POA: Diagnosis not present

## 2016-02-03 DIAGNOSIS — M79672 Pain in left foot: Secondary | ICD-10-CM | POA: Diagnosis not present

## 2016-02-03 DIAGNOSIS — M722 Plantar fascial fibromatosis: Secondary | ICD-10-CM | POA: Diagnosis not present

## 2016-02-03 DIAGNOSIS — G5752 Tarsal tunnel syndrome, left lower limb: Secondary | ICD-10-CM | POA: Diagnosis not present

## 2016-02-03 DIAGNOSIS — M76822 Posterior tibial tendinitis, left leg: Secondary | ICD-10-CM | POA: Diagnosis not present

## 2016-02-05 DIAGNOSIS — M76822 Posterior tibial tendinitis, left leg: Secondary | ICD-10-CM | POA: Diagnosis not present

## 2016-02-05 DIAGNOSIS — M79672 Pain in left foot: Secondary | ICD-10-CM | POA: Diagnosis not present

## 2016-02-05 DIAGNOSIS — G5752 Tarsal tunnel syndrome, left lower limb: Secondary | ICD-10-CM | POA: Diagnosis not present

## 2016-02-05 DIAGNOSIS — M722 Plantar fascial fibromatosis: Secondary | ICD-10-CM | POA: Diagnosis not present

## 2016-02-10 DIAGNOSIS — M79672 Pain in left foot: Secondary | ICD-10-CM | POA: Diagnosis not present

## 2016-02-10 DIAGNOSIS — M722 Plantar fascial fibromatosis: Secondary | ICD-10-CM | POA: Diagnosis not present

## 2016-02-10 DIAGNOSIS — G5752 Tarsal tunnel syndrome, left lower limb: Secondary | ICD-10-CM | POA: Diagnosis not present

## 2016-02-10 DIAGNOSIS — M76822 Posterior tibial tendinitis, left leg: Secondary | ICD-10-CM | POA: Diagnosis not present

## 2016-02-17 DIAGNOSIS — M722 Plantar fascial fibromatosis: Secondary | ICD-10-CM | POA: Diagnosis not present

## 2016-02-17 DIAGNOSIS — G5752 Tarsal tunnel syndrome, left lower limb: Secondary | ICD-10-CM | POA: Diagnosis not present

## 2016-02-17 DIAGNOSIS — M76822 Posterior tibial tendinitis, left leg: Secondary | ICD-10-CM | POA: Diagnosis not present

## 2016-02-17 DIAGNOSIS — M79672 Pain in left foot: Secondary | ICD-10-CM | POA: Diagnosis not present

## 2016-02-19 DIAGNOSIS — M722 Plantar fascial fibromatosis: Secondary | ICD-10-CM | POA: Diagnosis not present

## 2016-02-19 DIAGNOSIS — G5752 Tarsal tunnel syndrome, left lower limb: Secondary | ICD-10-CM | POA: Diagnosis not present

## 2016-02-19 DIAGNOSIS — M76822 Posterior tibial tendinitis, left leg: Secondary | ICD-10-CM | POA: Diagnosis not present

## 2016-02-19 DIAGNOSIS — M79672 Pain in left foot: Secondary | ICD-10-CM | POA: Diagnosis not present

## 2016-02-26 DIAGNOSIS — G5752 Tarsal tunnel syndrome, left lower limb: Secondary | ICD-10-CM | POA: Diagnosis not present

## 2016-02-26 DIAGNOSIS — M76822 Posterior tibial tendinitis, left leg: Secondary | ICD-10-CM | POA: Diagnosis not present

## 2016-02-26 DIAGNOSIS — M722 Plantar fascial fibromatosis: Secondary | ICD-10-CM | POA: Diagnosis not present

## 2016-02-26 DIAGNOSIS — M79672 Pain in left foot: Secondary | ICD-10-CM | POA: Diagnosis not present

## 2016-03-02 DIAGNOSIS — G5752 Tarsal tunnel syndrome, left lower limb: Secondary | ICD-10-CM | POA: Diagnosis not present

## 2016-03-02 DIAGNOSIS — M76822 Posterior tibial tendinitis, left leg: Secondary | ICD-10-CM | POA: Diagnosis not present

## 2016-03-02 DIAGNOSIS — M79672 Pain in left foot: Secondary | ICD-10-CM | POA: Diagnosis not present

## 2016-03-02 DIAGNOSIS — M722 Plantar fascial fibromatosis: Secondary | ICD-10-CM | POA: Diagnosis not present

## 2016-03-04 DIAGNOSIS — M722 Plantar fascial fibromatosis: Secondary | ICD-10-CM | POA: Diagnosis not present

## 2016-03-04 DIAGNOSIS — M76822 Posterior tibial tendinitis, left leg: Secondary | ICD-10-CM | POA: Diagnosis not present

## 2016-03-04 DIAGNOSIS — M79672 Pain in left foot: Secondary | ICD-10-CM | POA: Diagnosis not present

## 2016-03-04 DIAGNOSIS — G5752 Tarsal tunnel syndrome, left lower limb: Secondary | ICD-10-CM | POA: Diagnosis not present

## 2016-03-09 DIAGNOSIS — M722 Plantar fascial fibromatosis: Secondary | ICD-10-CM | POA: Diagnosis not present

## 2016-03-09 DIAGNOSIS — M79672 Pain in left foot: Secondary | ICD-10-CM | POA: Diagnosis not present

## 2016-03-09 DIAGNOSIS — G5752 Tarsal tunnel syndrome, left lower limb: Secondary | ICD-10-CM | POA: Diagnosis not present

## 2016-03-09 DIAGNOSIS — M76822 Posterior tibial tendinitis, left leg: Secondary | ICD-10-CM | POA: Diagnosis not present

## 2016-03-18 DIAGNOSIS — M79672 Pain in left foot: Secondary | ICD-10-CM | POA: Diagnosis not present

## 2016-03-18 DIAGNOSIS — M76822 Posterior tibial tendinitis, left leg: Secondary | ICD-10-CM | POA: Diagnosis not present

## 2016-03-18 DIAGNOSIS — M722 Plantar fascial fibromatosis: Secondary | ICD-10-CM | POA: Diagnosis not present

## 2016-03-18 DIAGNOSIS — G5752 Tarsal tunnel syndrome, left lower limb: Secondary | ICD-10-CM | POA: Diagnosis not present

## 2016-03-23 DIAGNOSIS — G5752 Tarsal tunnel syndrome, left lower limb: Secondary | ICD-10-CM | POA: Diagnosis not present

## 2016-03-23 DIAGNOSIS — M76822 Posterior tibial tendinitis, left leg: Secondary | ICD-10-CM | POA: Diagnosis not present

## 2016-03-23 DIAGNOSIS — M79672 Pain in left foot: Secondary | ICD-10-CM | POA: Diagnosis not present

## 2016-03-23 DIAGNOSIS — M722 Plantar fascial fibromatosis: Secondary | ICD-10-CM | POA: Diagnosis not present

## 2016-07-23 DIAGNOSIS — R351 Nocturia: Secondary | ICD-10-CM | POA: Diagnosis not present

## 2016-07-23 DIAGNOSIS — Z8546 Personal history of malignant neoplasm of prostate: Secondary | ICD-10-CM | POA: Diagnosis not present

## 2016-08-09 DIAGNOSIS — H25812 Combined forms of age-related cataract, left eye: Secondary | ICD-10-CM | POA: Diagnosis not present

## 2016-08-09 DIAGNOSIS — H527 Unspecified disorder of refraction: Secondary | ICD-10-CM | POA: Diagnosis not present

## 2016-08-09 DIAGNOSIS — Z961 Presence of intraocular lens: Secondary | ICD-10-CM | POA: Diagnosis not present

## 2016-08-09 DIAGNOSIS — H40023 Open angle with borderline findings, high risk, bilateral: Secondary | ICD-10-CM | POA: Diagnosis not present

## 2016-08-16 DIAGNOSIS — Z0000 Encounter for general adult medical examination without abnormal findings: Secondary | ICD-10-CM | POA: Diagnosis not present

## 2016-08-16 DIAGNOSIS — Z1389 Encounter for screening for other disorder: Secondary | ICD-10-CM | POA: Diagnosis not present

## 2016-09-07 DIAGNOSIS — Z961 Presence of intraocular lens: Secondary | ICD-10-CM | POA: Diagnosis not present

## 2016-09-07 DIAGNOSIS — H25812 Combined forms of age-related cataract, left eye: Secondary | ICD-10-CM | POA: Diagnosis not present

## 2016-09-07 DIAGNOSIS — H43393 Other vitreous opacities, bilateral: Secondary | ICD-10-CM | POA: Diagnosis not present

## 2016-09-07 DIAGNOSIS — H401132 Primary open-angle glaucoma, bilateral, moderate stage: Secondary | ICD-10-CM | POA: Diagnosis not present

## 2016-10-11 DIAGNOSIS — H25812 Combined forms of age-related cataract, left eye: Secondary | ICD-10-CM | POA: Diagnosis not present

## 2016-10-11 DIAGNOSIS — H401132 Primary open-angle glaucoma, bilateral, moderate stage: Secondary | ICD-10-CM | POA: Diagnosis not present

## 2016-10-11 DIAGNOSIS — H43393 Other vitreous opacities, bilateral: Secondary | ICD-10-CM | POA: Diagnosis not present

## 2016-10-11 DIAGNOSIS — Z961 Presence of intraocular lens: Secondary | ICD-10-CM | POA: Diagnosis not present

## 2016-11-18 DIAGNOSIS — Z23 Encounter for immunization: Secondary | ICD-10-CM | POA: Diagnosis not present

## 2017-02-01 DIAGNOSIS — N529 Male erectile dysfunction, unspecified: Secondary | ICD-10-CM | POA: Diagnosis not present

## 2017-02-01 DIAGNOSIS — M25572 Pain in left ankle and joints of left foot: Secondary | ICD-10-CM | POA: Diagnosis not present

## 2017-02-01 DIAGNOSIS — G8929 Other chronic pain: Secondary | ICD-10-CM | POA: Diagnosis not present

## 2017-02-08 DIAGNOSIS — M25572 Pain in left ankle and joints of left foot: Secondary | ICD-10-CM | POA: Diagnosis not present

## 2017-02-08 DIAGNOSIS — M79672 Pain in left foot: Secondary | ICD-10-CM | POA: Diagnosis not present

## 2017-02-28 DIAGNOSIS — H25812 Combined forms of age-related cataract, left eye: Secondary | ICD-10-CM | POA: Diagnosis not present

## 2017-02-28 DIAGNOSIS — H401132 Primary open-angle glaucoma, bilateral, moderate stage: Secondary | ICD-10-CM | POA: Diagnosis not present

## 2017-02-28 DIAGNOSIS — Z961 Presence of intraocular lens: Secondary | ICD-10-CM | POA: Diagnosis not present

## 2017-02-28 DIAGNOSIS — H43393 Other vitreous opacities, bilateral: Secondary | ICD-10-CM | POA: Diagnosis not present

## 2017-03-29 DIAGNOSIS — M779 Enthesopathy, unspecified: Secondary | ICD-10-CM | POA: Diagnosis not present

## 2017-03-29 DIAGNOSIS — M76822 Posterior tibial tendinitis, left leg: Secondary | ICD-10-CM | POA: Diagnosis not present

## 2017-04-25 DIAGNOSIS — M25572 Pain in left ankle and joints of left foot: Secondary | ICD-10-CM | POA: Diagnosis not present

## 2017-07-07 DIAGNOSIS — M25552 Pain in left hip: Secondary | ICD-10-CM | POA: Diagnosis not present

## 2017-07-18 DIAGNOSIS — M25572 Pain in left ankle and joints of left foot: Secondary | ICD-10-CM | POA: Diagnosis not present

## 2017-07-21 DIAGNOSIS — M25552 Pain in left hip: Secondary | ICD-10-CM | POA: Diagnosis not present

## 2017-07-21 DIAGNOSIS — M25572 Pain in left ankle and joints of left foot: Secondary | ICD-10-CM | POA: Diagnosis not present

## 2017-08-11 DIAGNOSIS — M25552 Pain in left hip: Secondary | ICD-10-CM | POA: Diagnosis not present

## 2017-08-22 DIAGNOSIS — Z8546 Personal history of malignant neoplasm of prostate: Secondary | ICD-10-CM | POA: Diagnosis not present

## 2017-08-22 DIAGNOSIS — R351 Nocturia: Secondary | ICD-10-CM | POA: Diagnosis not present

## 2017-08-22 DIAGNOSIS — N5201 Erectile dysfunction due to arterial insufficiency: Secondary | ICD-10-CM | POA: Diagnosis not present

## 2017-08-29 DIAGNOSIS — K219 Gastro-esophageal reflux disease without esophagitis: Secondary | ICD-10-CM | POA: Diagnosis not present

## 2017-08-29 DIAGNOSIS — R1032 Left lower quadrant pain: Secondary | ICD-10-CM | POA: Diagnosis not present

## 2017-08-29 DIAGNOSIS — Z8546 Personal history of malignant neoplasm of prostate: Secondary | ICD-10-CM | POA: Diagnosis not present

## 2017-08-29 DIAGNOSIS — R82998 Other abnormal findings in urine: Secondary | ICD-10-CM | POA: Diagnosis not present

## 2017-08-29 DIAGNOSIS — I480 Paroxysmal atrial fibrillation: Secondary | ICD-10-CM | POA: Diagnosis not present

## 2017-08-29 DIAGNOSIS — M1612 Unilateral primary osteoarthritis, left hip: Secondary | ICD-10-CM | POA: Diagnosis not present

## 2017-08-29 DIAGNOSIS — Z1389 Encounter for screening for other disorder: Secondary | ICD-10-CM | POA: Diagnosis not present

## 2017-08-29 DIAGNOSIS — Z0000 Encounter for general adult medical examination without abnormal findings: Secondary | ICD-10-CM | POA: Diagnosis not present

## 2017-08-29 DIAGNOSIS — L720 Epidermal cyst: Secondary | ICD-10-CM | POA: Diagnosis not present

## 2017-08-29 DIAGNOSIS — E7800 Pure hypercholesterolemia, unspecified: Secondary | ICD-10-CM | POA: Diagnosis not present

## 2017-10-14 ENCOUNTER — Ambulatory Visit: Payer: Self-pay | Admitting: Surgery

## 2017-10-14 DIAGNOSIS — L720 Epidermal cyst: Secondary | ICD-10-CM | POA: Diagnosis not present

## 2017-10-14 DIAGNOSIS — N5089 Other specified disorders of the male genital organs: Secondary | ICD-10-CM | POA: Diagnosis not present

## 2017-10-25 ENCOUNTER — Other Ambulatory Visit: Payer: Self-pay | Admitting: Orthopedic Surgery

## 2017-10-25 ENCOUNTER — Other Ambulatory Visit: Payer: Self-pay

## 2017-10-25 ENCOUNTER — Encounter (HOSPITAL_BASED_OUTPATIENT_CLINIC_OR_DEPARTMENT_OTHER): Payer: Self-pay | Admitting: *Deleted

## 2017-10-25 ENCOUNTER — Encounter (HOSPITAL_COMMUNITY): Payer: Self-pay | Admitting: *Deleted

## 2017-10-30 ENCOUNTER — Encounter (HOSPITAL_BASED_OUTPATIENT_CLINIC_OR_DEPARTMENT_OTHER): Payer: Self-pay | Admitting: Surgery

## 2017-10-30 DIAGNOSIS — L720 Epidermal cyst: Secondary | ICD-10-CM | POA: Diagnosis present

## 2017-10-30 NOTE — H&P (Signed)
General Surgery [facility]  Irene Pap Documented: 10/14/2017 4:19 PM Location: [place] [facility] Patient #: [id number] DOB: [birth year] Married / Language: English / Race: Black or African American Male   History of Present Illness Earnstine Regal MD; 10/14/2017 4:42 PM) The patient is a 72 year old male who presents with an epidermal cyst.  CHIEF COMPLAINT: epidermal cysts of back, left groin pain  Patient is referred by Dr. Lavone Orn for evaluation of epidermal cyst on the back and left groin pain. Patient notes that the cyst in the mid back and been present for a few years. They have gradually increased in size. They have been manipulated in the past by his primary care physician but they have recurred and become larger. Patient desires surgical excision. He has not had any previous such lesions removed. Patient also notes chronic left groin pain. He had undergone inguinal hernia repair approximately 5 years ago by one of my retired partner's. Patient had developed pain following surgery which has persisted. It is intermittent. It is aggravated by physical activity. He has been examined by his primary care physician and no evidence of recurrent hernia has been identified. Patient presents today for evaluation.   Problem List/Past Medical Earnstine Regal, MD; 10/14/2017 4:47 PM) EPIDERMAL CYST (L72.0)  SPERMATIC CORD MASS (N50.89)   Past Surgical History Geni Bers Haggett; 10/14/2017 4:19 PM) Open Inguinal Hernia Surgery  Left.  Diagnostic Studies History Geni Bers Haggett; 10/14/2017 4:19 PM) Colonoscopy  5-10 years ago  Allergies Geni Bers Haggett; 10/14/2017 4:19 PM) No Known Drug Allergies [10/14/2017]: Allergies Reconciled   Medication History Geni Bers Haggett; 10/14/2017 4:20 PM) Viagra (Oral) Specific strength unknown - Active. Claritin (Oral) Specific strength unknown - Active. Medications Reconciled  Social  History Geni Bers Haggett; 10/14/2017 4:19 PM) Alcohol use  Moderate alcohol use. Caffeine use  Coffee. No drug use  Tobacco use  Never smoker.  Family History Geni Bers Haggett; 10/14/2017 4:19 PM) Alcohol Abuse  Brother. Arthritis  Father. Depression  Father. Prostate Cancer  Father. Thyroid problems  Father, Mother.  Other Problems Earnstine Regal, MD; 10/14/2017 4:47 PM) Back Pain  Gastroesophageal Reflux Disease  Inguinal Hernia  Prostate Cancer     Review of Systems Geni Bers Haggett; 10/14/2017 4:19 PM) General Not Present- Appetite Loss, Chills, Fatigue, Fever, Night Sweats, Weight Gain and Weight Loss. Skin Not Present- Change in Wart/Mole, Dryness, Hives, Jaundice, New Lesions, Non-Healing Wounds, Rash and Ulcer. HEENT Not Present- Earache, Hearing Loss, Hoarseness, Nose Bleed, Oral Ulcers, Ringing in the Ears, Seasonal Allergies, Sinus Pain, Sore Throat, Visual Disturbances, Wears glasses/contact lenses and Yellow Eyes. Respiratory Not Present- Bloody sputum, Chronic Cough, Difficulty Breathing, Snoring and Wheezing. Breast Not Present- Breast Mass, Breast Pain, Nipple Discharge and Skin Changes. Cardiovascular Not Present- Chest Pain, Difficulty Breathing Lying Down, Leg Cramps, Palpitations, Rapid Heart Rate, Shortness of Breath and Swelling of Extremities. Gastrointestinal Not Present- Abdominal Pain, Bloating, Bloody Stool, Change in Bowel Habits, Chronic diarrhea, Constipation, Difficulty Swallowing, Excessive gas, Gets full quickly at meals, Hemorrhoids, Indigestion, Nausea, Rectal Pain and Vomiting. Male Genitourinary Not Present- Blood in Urine, Change in Urinary Stream, Frequency, Impotence, Nocturia, Painful Urination, Urgency and Urine Leakage. Musculoskeletal Present- Back Pain and Swelling of Extremities. Not Present- Joint Pain, Joint Stiffness, Muscle Pain and Muscle Weakness. Neurological Not Present- Decreased Memory, Fainting, Headaches,  Numbness, Seizures, Tingling, Tremor, Trouble walking and Weakness. Psychiatric Not Present- Anxiety, Bipolar, Change in Sleep Pattern, Depression, Fearful and Frequent crying. Endocrine Not Present- Cold Intolerance, Excessive  Hunger, Hair Changes, Heat Intolerance, Hot flashes and New Diabetes. Hematology Not Present- Blood Thinners, Easy Bruising, Excessive bleeding, Gland problems, HIV and Persistent Infections.  Vitals Geni Bers Haggett; 10/14/2017 4:20 PM) 10/14/2017 4:20 PM Weight: 240.2 lb Height: 75in Body Surface Area: 2.37 m Body Mass Index: 30.02 kg/m  Pain Level: 7/10 Temp.: 98.13F(Temporal)  Pulse: 91 (Regular)  P.OX: 97% (Room air) BP: 162/96 (Sitting, Left Arm, Standard) Left Groin Pain      Physical Exam Earnstine Regal MD; 10/14/2017 4:44 PM) The physical exam findings are as follows: Note:See vital signs recorded above  GENERAL APPEARANCE Development: normal Nutritional status: normal Gross deformities: none  SKIN Rash, lesions, ulcers: none Induration, erythema: none Nodules: none palpable  EYES Conjunctiva and lids: normal Pupils: equal and reactive Iris: normal bilaterally  EARS, NOSE, MOUTH, THROAT External ears: no lesion or deformity External nose: no lesion or deformity Hearing: grossly normal Lips: no lesion or deformity Dentition: normal for age Oral mucosa: moist  NECK Symmetric: yes Trachea: midline Thyroid: no palpable nodules in the thyroid bed  CHEST Respiratory effort: normal Retraction or accessory muscle use: no Breath sounds: normal bilaterally Rales, rhonchi, wheeze: none  CARDIOVASCULAR Auscultation: regular rhythm, normal rate Murmurs: none Pulses: carotid and radial pulse 2+ palpable Lower extremity edema: none Lower extremity varicosities: none  ABDOMEN Distension: none Masses: none palpable Tenderness: none Hepatosplenomegaly: not present Hernia: not present  GENITOURINARY Penis: no  lesions Scrotum: no masses Palpation in the inguinal canal on both the right and left side with cough and Valsalva shows no sign of hernia. There is a well-healed incision in the left groin consistent with inguinal hernia repair. Palpation however in the left inguinal canal reveals approximately 1 cm rounded firm mobile mass which is tender to palpation. This is just outside the external inguinal ring.  BACK In the mid line of the back are 2 epidermal inclusion cyst measuring 3 cm and 1 cm each in greatest dimension. They are subcutaneous. There is no sign of acute inflammation or infection.  MUSCULOSKELETAL Station and gait: normal Digits and nails: no clubbing or cyanosis Muscle strength: grossly normal all extremities Range of motion: grossly normal all extremities Deformity: none  LYMPHATIC Cervical: none palpable Supraclavicular: none palpable  PSYCHIATRIC Oriented to person, place, and time: yes Mood and affect: normal for situation Judgment and insight: appropriate for situation    Assessment & Plan Earnstine Regal MD; 10/14/2017 4:47 PM) EPIDERMAL CYST (L72.0) SPERMATIC CORD MASS (N50.89) Current Plans Patient complains of left groin pain dating back to near the time of his left inguinal hernia repair. On examination there is a 1 cm mass in the upper spermatic cord just outside the external inguinal ring. This is tender to palpation. It is mobile. This may represent a neuroma related to trauma from his prior hernia repair. I cannot rule out a spermatocele or other mass involving the spermatic cord. I would recommend referral to the patient's urologist, Dr. Irine Seal, for further evaluation, possibly including an ultrasound of the groin. I will ask Dr. Lavone Orn to make these arrangements. I will copy of this report to Dr. Jeffie Pollock.  Patient has 2 epidermal inclusion cyst in the mid back. These are mildly symptomatic. He would like to have these excised. We will make  arrangements to do this at the [facility] under mild sedation and local anesthesia at a time convenient for the patient in the near future.  The risks and benefits of the procedure have  been discussed at length with the patient. The patient understands the proposed procedure, potential alternative treatments, and the course of recovery to be expected. All of the patient's questions have been answered at this time. The patient wishes to proceed with surgery.   Armandina Gemma, [place] [facility] Office: [phone]

## 2017-10-31 DIAGNOSIS — N4341 Spermatocele of epididymis, single: Secondary | ICD-10-CM | POA: Diagnosis not present

## 2017-11-01 ENCOUNTER — Ambulatory Visit (HOSPITAL_BASED_OUTPATIENT_CLINIC_OR_DEPARTMENT_OTHER): Payer: Medicare Other | Admitting: Anesthesiology

## 2017-11-01 ENCOUNTER — Other Ambulatory Visit: Payer: Self-pay

## 2017-11-01 ENCOUNTER — Ambulatory Visit (HOSPITAL_BASED_OUTPATIENT_CLINIC_OR_DEPARTMENT_OTHER)
Admission: RE | Admit: 2017-11-01 | Discharge: 2017-11-01 | Disposition: A | Discharge status: Home / Self Care | Payer: Medicare Other | Source: Ambulatory Visit | Attending: Surgery | Admitting: Surgery

## 2017-11-01 ENCOUNTER — Encounter (HOSPITAL_BASED_OUTPATIENT_CLINIC_OR_DEPARTMENT_OTHER)
Admission: RE | Disposition: A | Discharge status: Home / Self Care | Payer: Self-pay | Source: Ambulatory Visit | Attending: Surgery

## 2017-11-01 ENCOUNTER — Encounter (HOSPITAL_BASED_OUTPATIENT_CLINIC_OR_DEPARTMENT_OTHER): Payer: Self-pay | Admitting: *Deleted

## 2017-11-01 DIAGNOSIS — L720 Epidermal cyst: Secondary | ICD-10-CM | POA: Diagnosis not present

## 2017-11-01 DIAGNOSIS — Z8546 Personal history of malignant neoplasm of prostate: Secondary | ICD-10-CM | POA: Insufficient documentation

## 2017-11-01 DIAGNOSIS — Z79899 Other long term (current) drug therapy: Secondary | ICD-10-CM | POA: Diagnosis not present

## 2017-11-01 HISTORY — PX: MASS EXCISION: SHX2000

## 2017-11-01 HISTORY — DX: Epidermal cyst: L72.0

## 2017-11-01 SURGERY — MINOR EXCISION OF MASS
Anesthesia: Monitor Anesthesia Care | Site: Back

## 2017-11-01 MED ORDER — FENTANYL CITRATE (PF) 100 MCG/2ML IJ SOLN: 50.0000 ug | INTRAMUSCULAR | Status: DC | PRN

## 2017-11-01 MED ORDER — CEFAZOLIN SODIUM-DEXTROSE 2-4 GM/100ML-% IV SOLN
INTRAVENOUS | Status: AC
  Filled 2017-11-01: qty 100

## 2017-11-01 MED ORDER — EPHEDRINE 5 MG/ML INJ
INTRAVENOUS | Status: AC
  Filled 2017-11-01: qty 10

## 2017-11-01 MED ORDER — CEFAZOLIN SODIUM-DEXTROSE 2-4 GM/100ML-% IV SOLN: 2.0000 g | INTRAVENOUS | Status: DC

## 2017-11-01 MED ORDER — ROCURONIUM BROMIDE 50 MG/5ML IV SOSY
PREFILLED_SYRINGE | INTRAVENOUS | Status: AC
  Filled 2017-11-01: qty 5

## 2017-11-01 MED ORDER — BUPIVACAINE HCL (PF) 0.5 % IJ SOLN
INTRAMUSCULAR | Status: AC
  Filled 2017-11-01: qty 30

## 2017-11-01 MED ORDER — MIDAZOLAM HCL 2 MG/2ML IJ SOLN: 1.0000 mg | INTRAMUSCULAR | Status: DC | PRN

## 2017-11-01 MED ORDER — BUPIVACAINE-EPINEPHRINE (PF) 0.25% -1:200000 IJ SOLN
INTRAMUSCULAR | Status: DC | PRN
  Administered 2017-11-01: 20 mL

## 2017-11-01 MED ORDER — LIDOCAINE-EPINEPHRINE (PF) 1 %-1:200000 IJ SOLN
INTRAMUSCULAR | Status: AC
  Filled 2017-11-01: qty 30

## 2017-11-01 MED ORDER — PHENYLEPHRINE 40 MCG/ML (10ML) SYRINGE FOR IV PUSH (FOR BLOOD PRESSURE SUPPORT)
PREFILLED_SYRINGE | INTRAVENOUS | Status: AC
  Filled 2017-11-01: qty 10

## 2017-11-01 MED ORDER — BUPIVACAINE-EPINEPHRINE (PF) 0.25% -1:200000 IJ SOLN
INTRAMUSCULAR | Status: AC
  Filled 2017-11-01: qty 30

## 2017-11-01 MED ORDER — CHLORHEXIDINE GLUCONATE CLOTH 2 % EX PADS: 6.0000 | MEDICATED_PAD | Freq: Once | CUTANEOUS | Status: DC

## 2017-11-01 MED ORDER — FENTANYL CITRATE (PF) 100 MCG/2ML IJ SOLN
INTRAMUSCULAR | Status: AC
  Filled 2017-11-01: qty 2

## 2017-11-01 MED ORDER — SCOPOLAMINE 1 MG/3DAYS TD PT72: 1.0000 | MEDICATED_PATCH | Freq: Once | TRANSDERMAL | Status: DC | PRN

## 2017-11-01 MED ORDER — SUCCINYLCHOLINE CHLORIDE 200 MG/10ML IV SOSY
PREFILLED_SYRINGE | INTRAVENOUS | Status: AC
  Filled 2017-11-01: qty 10

## 2017-11-01 MED ORDER — TRAMADOL HCL 50 MG PO TABS: 50.0000 mg | ORAL_TABLET | Freq: Four times a day (QID) | ORAL | 0 refills | Status: DC | PRN

## 2017-11-01 MED ORDER — LIDOCAINE 2% (20 MG/ML) 5 ML SYRINGE
INTRAMUSCULAR | Status: AC
  Filled 2017-11-01: qty 5

## 2017-11-01 MED ORDER — LACTATED RINGERS IV SOLN: INTRAVENOUS | Status: DC

## 2017-11-01 MED ORDER — ONDANSETRON HCL 4 MG/2ML IJ SOLN
INTRAMUSCULAR | Status: AC
  Filled 2017-11-01: qty 2

## 2017-11-01 MED ORDER — LIDOCAINE HCL (PF) 1 % IJ SOLN
INTRAMUSCULAR | Status: AC
  Filled 2017-11-01: qty 30

## 2017-11-01 MED ORDER — MIDAZOLAM HCL 2 MG/2ML IJ SOLN
INTRAMUSCULAR | Status: AC
  Filled 2017-11-01: qty 2

## 2017-11-01 SURGICAL SUPPLY — 38 items
BLADE SURG 15 STRL LF DISP TIS (BLADE) ×2 IMPLANT
BLADE SURG 15 STRL SS (BLADE) ×2
CHLORAPREP W/TINT 26ML (MISCELLANEOUS) ×4 IMPLANT
CLEANER CAUTERY TIP 5X5 PAD (MISCELLANEOUS) ×2 IMPLANT
CLOSURE WOUND 1/2 X4 (GAUZE/BANDAGES/DRESSINGS)
COVER BACK TABLE 60X90IN (DRAPES) ×4 IMPLANT
COVER MAYO STAND STRL (DRAPES) ×4 IMPLANT
DERMABOND ADVANCED (GAUZE/BANDAGES/DRESSINGS) ×2
DERMABOND ADVANCED .7 DNX12 (GAUZE/BANDAGES/DRESSINGS) ×2 IMPLANT
DRAPE LAPAROTOMY 100X72 PEDS (DRAPES) ×4 IMPLANT
DRAPE UTILITY XL STRL (DRAPES) ×4 IMPLANT
DRSG TEGADERM 4X4.75 (GAUZE/BANDAGES/DRESSINGS) IMPLANT
ELECT REM PT RETURN 9FT ADLT (ELECTROSURGICAL) ×4
ELECTRODE REM PT RTRN 9FT ADLT (ELECTROSURGICAL) ×2 IMPLANT
GAUZE SPONGE 4X4 12PLY STRL LF (GAUZE/BANDAGES/DRESSINGS) ×4 IMPLANT
GLOVE BIO SURGEON STRL SZ 6.5 (GLOVE) ×3 IMPLANT
GLOVE BIO SURGEONS STRL SZ 6.5 (GLOVE) ×1
GLOVE BIOGEL PI IND STRL 7.0 (GLOVE) ×2 IMPLANT
GLOVE BIOGEL PI INDICATOR 7.0 (GLOVE) ×2
GLOVE SURG ORTHO 8.0 STRL STRW (GLOVE) ×4 IMPLANT
GOWN STRL REUS W/ TWL LRG LVL3 (GOWN DISPOSABLE) ×2 IMPLANT
GOWN STRL REUS W/ TWL XL LVL3 (GOWN DISPOSABLE) ×2 IMPLANT
GOWN STRL REUS W/TWL LRG LVL3 (GOWN DISPOSABLE) ×2
GOWN STRL REUS W/TWL XL LVL3 (GOWN DISPOSABLE) ×2
NEEDLE HYPO 25X1 1.5 SAFETY (NEEDLE) ×4 IMPLANT
PACK BASIN DAY SURGERY FS (CUSTOM PROCEDURE TRAY) ×4 IMPLANT
PAD CLEANER CAUTERY TIP 5X5 (MISCELLANEOUS) ×2
PENCIL BUTTON HOLSTER BLD 10FT (ELECTRODE) ×4 IMPLANT
STRIP CLOSURE SKIN 1/2X4 (GAUZE/BANDAGES/DRESSINGS) IMPLANT
SUT ETHILON 3 0 PS 1 (SUTURE) IMPLANT
SUT ETHILON 4 0 PS 2 18 (SUTURE) IMPLANT
SUT MNCRL AB 3-0 PS2 18 (SUTURE) ×4 IMPLANT
SUT MNCRL AB 4-0 PS2 18 (SUTURE) IMPLANT
SUT VICRYL 3-0 CR8 SH (SUTURE) ×4 IMPLANT
SUT VICRYL 4-0 PS2 18IN ABS (SUTURE) IMPLANT
SYR CONTROL 10ML LL (SYRINGE) ×4 IMPLANT
TOWEL GREEN STERILE FF (TOWEL DISPOSABLE) ×8 IMPLANT
TOWEL OR NON WOVEN STRL DISP B (DISPOSABLE) ×4 IMPLANT

## 2017-11-01 NOTE — Interval H&P Note (Signed)
History and Physical Interval Note:  11/01/2017 1:58 PM  Brandon Dyer  has presented today for surgery, with the diagnosis of EPIDERMAL CYSTS.  The various methods of treatment have been discussed with the patient and family. After consideration of risks, benefits and other options for treatment, the patient has consented to    Procedure(s): EXCISION OF EPIDERMAL CYSTS 2 FROM MID BACK (N/A) as a surgical intervention .    The patient's history has been reviewed, patient examined, no change in status, stable for surgery.  I have reviewed the patient's chart and labs.  Questions were answered to the patient's satisfaction.    Armandina Gemma, [place] [facility] Office: [place]

## 2017-11-01 NOTE — Op Note (Signed)
Operative Note  Pre-operative Diagnosis:  Epidermal inclusion cysts, mid back  Post-operative Diagnosis:  same  Surgeon:  Armandina Gemma, MD  Assistant:  none   Procedure:  Excision of epidermal inclusion cysts mid back, 4 x 2 x 2 cm in total  Anesthesia:  local  Estimated Blood Loss:  minimal  Drains: none         Specimen: to pathology  Indications:  Patient with two adjacent epidermal cysts in the mid back, enlarging, mildly uncomfortable, for excision.  Procedure Details:  The patient was seen in the pre-op holding area. The risks, benefits, complications, treatment options, and expected outcomes were previously discussed with the patient. The patient agreed with the proposed plan and has signed the informed consent form.  The patient was brought to the operating room by the surgical team, identified as Irene Pap and the procedure verified. A "time out" was completed and the above information confirmed.  Patient was placed prone.  Skin was prepped and draped in the usual aseptic fashion.  Local anesthetic was infiltrated throughout the operative field.  An elliptical incision was made so as to encompassed both cysts.  Dissection was carried into the deep subcutaneous tissues and the lesions excised in their entirety using the electrocautery for hemostasis.  Specimen was submitted to pathology.  Subcutaneous tissues were closed with interrupted 3-0 vicryl sutures.  Skin was closed with a running 3-0 Monocryl suture.  Dermabond was applied as dressing.  Procedure was well tolerated.   Armandina Gemma, MD [facility] Office: [phone]

## 2017-11-01 NOTE — Anesthesia Preprocedure Evaluation (Deleted)
Anesthesia Evaluation    Reviewed: Allergy & Precautions, Patient's Chart, lab work & pertinent test results  History of Anesthesia Complications Negative for: history of anesthetic complications  Airway        Dental   Pulmonary neg pulmonary ROS,           Cardiovascular negative cardio ROS       Neuro/Psych negative neurological ROS  negative psych ROS   GI/Hepatic Neg liver ROS, GERD  Medicated and Controlled,  Endo/Other   Obesity   Renal/GU negative Renal ROS    Prostate cancer s/p radiation     Musculoskeletal negative musculoskeletal ROS (+)   Abdominal   Peds  Hematology negative hematology ROS (+)   Anesthesia Other Findings   Reproductive/Obstetrics                             Anesthesia Physical Anesthesia Plan  ASA: II  Anesthesia Plan: MAC   Post-op Pain Management:    Induction: Intravenous  PONV Risk Score and Plan: 1 and Propofol infusion and Treatment may vary due to age or medical condition  Airway Management Planned: Natural Airway and Simple Face Mask  Additional Equipment: None  Intra-op Plan:   Post-operative Plan:   Informed Consent:   Plan Discussed with: CRNA and Anesthesiologist  Anesthesia Plan Comments:         Anesthesia Quick Evaluation

## 2017-11-02 ENCOUNTER — Encounter (HOSPITAL_BASED_OUTPATIENT_CLINIC_OR_DEPARTMENT_OTHER): Payer: Self-pay | Admitting: Surgery

## 2017-11-02 DIAGNOSIS — Z23 Encounter for immunization: Secondary | ICD-10-CM | POA: Diagnosis not present

## 2017-11-10 ENCOUNTER — Other Ambulatory Visit: Payer: Self-pay

## 2017-11-10 ENCOUNTER — Ambulatory Visit (HOSPITAL_COMMUNITY)
Admission: RE | Admit: 2017-11-10 | Discharge: 2017-11-10 | Disposition: A | Discharge status: Home / Self Care | Payer: Medicare Other | Source: Ambulatory Visit | Attending: Orthopedic Surgery | Admitting: Orthopedic Surgery

## 2017-11-10 ENCOUNTER — Encounter (HOSPITAL_COMMUNITY)
Admission: RE | Admit: 2017-11-10 | Discharge: 2017-11-10 | Disposition: A | Discharge status: Home / Self Care | Payer: Medicare Other | Source: Ambulatory Visit | Attending: Orthopedic Surgery | Admitting: Orthopedic Surgery

## 2017-11-10 ENCOUNTER — Other Ambulatory Visit (HOSPITAL_COMMUNITY): Payer: Medicare Other

## 2017-11-10 ENCOUNTER — Encounter (HOSPITAL_COMMUNITY): Payer: Self-pay

## 2017-11-10 DIAGNOSIS — J986 Disorders of diaphragm: Secondary | ICD-10-CM | POA: Diagnosis not present

## 2017-11-10 DIAGNOSIS — Z01818 Encounter for other preprocedural examination: Secondary | ICD-10-CM

## 2017-11-10 LAB — URINALYSIS, ROUTINE W REFLEX MICROSCOPIC
Bilirubin Urine: NEGATIVE
Glucose, UA: NEGATIVE mg/dL
Hgb urine dipstick: NEGATIVE
KETONES UR: NEGATIVE mg/dL
LEUKOCYTES UA: NEGATIVE
NITRITE: NEGATIVE
PROTEIN: NEGATIVE mg/dL
Specific Gravity, Urine: 1.02 (ref 1.005–1.030)
pH: 6 (ref 5.0–8.0)

## 2017-11-10 LAB — COMPREHENSIVE METABOLIC PANEL
ALBUMIN: 4 g/dL (ref 3.5–5.0)
ALK PHOS: 64 U/L (ref 38–126)
ALT: 12 U/L (ref 0–44)
AST: 19 U/L (ref 15–41)
Anion gap: 8 (ref 5–15)
BUN: 18 mg/dL (ref 8–23)
CALCIUM: 9.5 mg/dL (ref 8.9–10.3)
CHLORIDE: 107 mmol/L (ref 98–111)
CO2: 27 mmol/L (ref 22–32)
Creatinine, Ser: 1.07 mg/dL (ref 0.61–1.24)
GFR calc non Af Amer: >60 mL/min (ref >60)
GLUCOSE: 92 mg/dL (ref 70–99)
Potassium: 4.3 mmol/L (ref 3.5–5.1)
SODIUM: 142 mmol/L (ref 135–145)
Total Bilirubin: 0.7 mg/dL (ref 0.3–1.2)
Total Protein: 6.9 g/dL (ref 6.5–8.1)

## 2017-11-10 LAB — CBC WITH DIFFERENTIAL/PLATELET
ABS IMMATURE GRANULOCYTES: 0.02 10*3/uL (ref 0.00–0.07)
Basophils Absolute: 0.1 10*3/uL (ref 0.0–0.1)
Basophils Relative: 1 %
EOS PCT: 2 %
Eosinophils Absolute: 0.1 10*3/uL (ref 0.0–0.5)
HCT: 42.2 % (ref 39.0–52.0)
HEMOGLOBIN: 13.5 g/dL (ref 13.0–17.0)
Immature Granulocytes: 0 %
LYMPHS ABS: 1.4 10*3/uL (ref 0.7–4.0)
LYMPHS PCT: 27 %
MCH: 30.8 pg (ref 26.0–34.0)
MCHC: 32 g/dL (ref 30.0–36.0)
MCV: 96.1 fL (ref 80.0–100.0)
MONO ABS: 0.5 10*3/uL (ref 0.1–1.0)
MONOS PCT: 10 %
Neutro Abs: 3.1 10*3/uL (ref 1.7–7.7)
Neutrophils Relative %: 60 %
Platelets: 171 10*3/uL (ref 150–400)
RBC: 4.39 MIL/uL (ref 4.22–5.81)
RDW: 13.1 % (ref 11.5–15.5)
WBC: 5.3 10*3/uL (ref 4.0–10.5)
nRBC: 0 % (ref 0.0–0.2)

## 2017-11-10 LAB — APTT: aPTT: 32 seconds (ref 24–36)

## 2017-11-10 LAB — PROTIME-INR
INR: 0.96
Prothrombin Time: 12.7 seconds (ref 11.4–15.2)

## 2017-11-10 LAB — SURGICAL PCR SCREEN
MRSA, PCR: NEGATIVE
STAPHYLOCOCCUS AUREUS: NEGATIVE

## 2017-11-10 NOTE — Patient Instructions (Signed)
Brandon Dyer  11/10/2017   Your procedure is scheduled on: Friday 11/18/2017  Report to [facility] Main  Entrance              Report to admitting at   0730 AM    Call this number if you have problems the morning of surgery [phone]    Remember: Do not eat food or drink liquids :After Midnight.               BRUSH YOUR TEETH MORNING OF SURGERY AND RINSE YOUR MOUTH OUT, NO CHEWING GUM CANDY OR MINTS.     Take these medicines the morning of surgery with A SIP OF WATER: none                                You may not have any metal on your body including hair pins and              piercings  Do not wear jewelry, make-up, lotions, powders or perfumes, deodorant                          Men may shave face and neck.   Do not bring valuables to the hospital. [place].  Contacts, dentures or bridgework may not be worn into surgery.  Leave suitcase in the car. After surgery it may be brought to your room.                  Please read over the following fact sheets you were given: _____________________________________________________________________             [facility] - Preparing for Surgery Before surgery, you can play an important role.  Because skin is not sterile, your skin needs to be as free of germs as possible.  You can reduce the number of germs on your skin by washing with CHG (chlorahexidine gluconate) soap before surgery.  CHG is an antiseptic cleaner which kills germs and bonds with the skin to continue killing germs even after washing. Please DO NOT use if you have an allergy to CHG or antibacterial soaps.  If your skin becomes reddened/irritated stop using the CHG and inform your nurse when you arrive at Short Stay. Do not shave (including legs and underarms) for at least 48 hours prior to the first CHG shower.  You may shave your face/neck. Please follow these instructions  carefully:  1.  Shower with CHG Soap the night before surgery and the  morning of Surgery.  2.  If you choose to wash your hair, wash your hair first as usual with your  normal  shampoo.  3.  After you shampoo, rinse your hair and body thoroughly to remove the  shampoo.                           4.  Use CHG as you would any other liquid soap.  You can apply chg directly  to the skin and wash                       Gently with a scrungie or clean washcloth.  5.  Apply the CHG Soap to your body ONLY FROM THE NECK DOWN.   Do not use on face/ open                           Wound or open sores. Avoid contact with eyes, ears mouth and genitals (private parts).                       Wash face,  Genitals (private parts) with your normal soap.             6.  Wash thoroughly, paying special attention to the area where your surgery  will be performed.  7.  Thoroughly rinse your body with warm water from the neck down.  8.  DO NOT shower/wash with your normal soap after using and rinsing off  the CHG Soap.                9.  Pat yourself dry with a clean towel.            10.  Wear clean pajamas.            11.  Place clean sheets on your bed the night of your first shower and do not  sleep with pets. Day of Surgery : Do not apply any lotions/deodorants the morning of surgery.  Please wear clean clothes to the hospital/surgery center.  FAILURE TO FOLLOW THESE INSTRUCTIONS MAY RESULT IN THE CANCELLATION OF YOUR SURGERY PATIENT SIGNATURE_________________________________  NURSE SIGNATURE__________________________________  ________________________________________________________________________   [name]  An incentive spirometer is a tool that can help keep your lungs clear and active. This tool measures how well you are filling your lungs with each breath. Taking long deep breaths may help reverse or decrease the chance of developing breathing (pulmonary) problems (especially infection)  following:  A long period of time when you are unable to move or be active. BEFORE THE PROCEDURE   If the spirometer includes an indicator to show your best effort, your nurse or respiratory therapist will set it to a desired goal.  If possible, sit up straight or lean slightly forward. Try not to slouch.  Hold the incentive spirometer in an upright position. INSTRUCTIONS FOR USE  1. Sit on the edge of your bed if possible, or sit up as far as you can in bed or on a chair. 2. Hold the incentive spirometer in an upright position. 3. Breathe out normally. 4. Place the mouthpiece in your mouth and seal your lips tightly around it. 5. Breathe in slowly and as deeply as possible, raising the piston or the ball toward the top of the column. 6. Hold your breath for 3-5 seconds or for as long as possible. Allow the piston or ball to fall to the bottom of the column. 7. Remove the mouthpiece from your mouth and breathe out normally. 8. Rest for a few seconds and repeat Steps 1 through 7 at least 10 times every 1-2 hours when you are awake. Take your time and take a few normal breaths between deep breaths. 9. The spirometer may include an indicator to show your best effort. Use the indicator as a goal to work toward during each repetition. 10. After each set of 10 deep breaths, practice coughing to be sure your lungs are clear. If you have an incision (the cut made at the time of surgery), support your incision when coughing by placing a  pillow or rolled up towels firmly against it. Once you are able to get out of bed, walk around indoors and cough well. You may stop using the incentive spirometer when instructed by your caregiver.  RISKS AND COMPLICATIONS  Take your time so you do not get dizzy or light-headed.  If you are in pain, you may need to take or ask for pain medication before doing incentive spirometry. It is harder to take a deep breath if you are having pain. AFTER USE  Rest and  breathe slowly and easily.  It can be helpful to keep track of a log of your progress. Your caregiver can provide you with a simple table to help with this. If you are using the spirometer at home, follow these instructions: [place] IF:   You are having difficultly using the spirometer.  You have trouble using the spirometer as often as instructed.  Your pain medication is not giving enough relief while using the spirometer.  You develop fever of 100.5 F (38.1 C) or higher. SEEK IMMEDIATE MEDICAL CARE IF:   You cough up bloody sputum that had not been present before.  You develop fever of 102 F (38.9 C) or greater.  You develop worsening pain at or near the incision site. MAKE SURE YOU:   Understand these instructions.  Will watch your condition.  Will get help right away if you are not doing well or get worse. Document Released: 05/24/2006 Document Revised: 04/05/2011 Document Reviewed: 07/25/2006 ExitCare Patient Information 2014 ExitCare, [place].   ________________________________________________________________________  WHAT IS A BLOOD TRANSFUSION? Blood Transfusion Information  A transfusion is the replacement of blood or some of its parts. Blood is made up of multiple cells which provide different functions.  Red blood cells carry oxygen and are used for blood loss replacement.  White blood cells fight against infection.  Platelets control bleeding.  Plasma helps clot blood.  Other blood products are available for specialized needs, such as hemophilia or other clotting disorders. BEFORE THE TRANSFUSION  Who gives blood for transfusions?   Healthy volunteers who are fully evaluated to make sure their blood is safe. This is blood bank blood. Transfusion therapy is the safest it has ever been in the practice of medicine. Before blood is taken from a donor, a complete history is taken to make sure that person has no history of diseases nor engages in  risky social behavior (examples are intravenous drug use or sexual activity with multiple partners). The donor's travel history is screened to minimize risk of transmitting infections, such as malaria. The donated blood is tested for signs of infectious diseases, such as HIV and hepatitis. The blood is then tested to be sure it is compatible with you in order to minimize the chance of a transfusion reaction. If you or a relative donates blood, this is often done in anticipation of surgery and is not appropriate for emergency situations. It takes many days to process the donated blood. RISKS AND COMPLICATIONS Although transfusion therapy is very safe and saves many lives, the main dangers of transfusion include:   Getting an infectious disease.  Developing a transfusion reaction. This is an allergic reaction to something in the blood you were given. Every precaution is taken to prevent this. The decision to have a blood transfusion has been considered carefully by your caregiver before blood is given. Blood is not given unless the benefits outweigh the risks. AFTER THE TRANSFUSION  Right after receiving a blood transfusion, you  will usually feel much better and more energetic. This is especially true if your red blood cells have gotten low (anemic). The transfusion raises the level of the red blood cells which carry oxygen, and this usually causes an energy increase.  The nurse administering the transfusion will monitor you carefully for complications. HOME CARE INSTRUCTIONS  No special instructions are needed after a transfusion. You may find your energy is better. Speak with your caregiver about any limitations on activity for underlying diseases you may have. SEEK MEDICAL CARE IF:   Your condition is not improving after your transfusion.  You develop redness or irritation at the intravenous (IV) site. SEEK IMMEDIATE MEDICAL CARE IF:  Any of the following symptoms occur over the next 12  hours:  Shaking chills.  You have a temperature by mouth above 102 F (38.9 C), not controlled by medicine.  Chest, back, or muscle pain.  People around you feel you are not acting correctly or are confused.  Shortness of breath or difficulty breathing.  Dizziness and fainting.  You get a rash or develop hives.  You have a decrease in urine output.  Your urine turns a dark color or changes to pink, red, or brown. Any of the following symptoms occur over the next 10 days:  You have a temperature by mouth above 102 F (38.9 C), not controlled by medicine.  Shortness of breath.  Weakness after normal activity.  The white part of the eye turns yellow (jaundice).  You have a decrease in the amount of urine or are urinating less often.  Your urine turns a dark color or changes to pink, red, or brown. Document Released: 01/09/2000 Document Revised: 04/05/2011 Document Reviewed: 08/28/2007 [facility] Patient Information 2014 [place], [place].  _______________________________________________________________________

## 2017-11-11 ENCOUNTER — Other Ambulatory Visit: Payer: Self-pay | Admitting: Orthopedic Surgery

## 2017-11-11 LAB — ABO/RH: ABO/RH(D): O POS

## 2017-11-11 NOTE — Care Plan (Signed)
Spoke with patient. Will discharge to home with wife. Equipment ordered.

## 2017-11-18 ENCOUNTER — Inpatient Hospital Stay (HOSPITAL_COMMUNITY): Payer: Medicare Other | Admitting: Certified Registered Nurse Anesthetist

## 2017-11-18 ENCOUNTER — Inpatient Hospital Stay (HOSPITAL_COMMUNITY): Admission: RE | Admit: 2017-11-18 | Payer: Medicare Other | Source: Ambulatory Visit | Admitting: Orthopedic Surgery

## 2017-11-18 ENCOUNTER — Inpatient Hospital Stay (HOSPITAL_COMMUNITY): Payer: Medicare Other

## 2017-11-18 ENCOUNTER — Inpatient Hospital Stay (HOSPITAL_COMMUNITY)
Admission: RE | Admit: 2017-11-18 | Discharge: 2017-11-19 | DRG: 470 | Disposition: A | Discharge status: Home Health | Payer: Medicare Other | Attending: Orthopedic Surgery | Admitting: Orthopedic Surgery

## 2017-11-18 ENCOUNTER — Encounter (HOSPITAL_COMMUNITY): Admission: RE | Payer: Self-pay | Source: Ambulatory Visit

## 2017-11-18 ENCOUNTER — Encounter (HOSPITAL_COMMUNITY): Payer: Self-pay | Admitting: *Deleted

## 2017-11-18 ENCOUNTER — Other Ambulatory Visit: Payer: Self-pay

## 2017-11-18 ENCOUNTER — Encounter (HOSPITAL_COMMUNITY)
Admission: RE | Disposition: A | Discharge status: Home Health | Payer: Self-pay | Source: Home / Self Care | Attending: Orthopedic Surgery

## 2017-11-18 DIAGNOSIS — M1612 Unilateral primary osteoarthritis, left hip: Secondary | ICD-10-CM | POA: Diagnosis present

## 2017-11-18 DIAGNOSIS — Z9181 History of falling: Secondary | ICD-10-CM | POA: Diagnosis not present

## 2017-11-18 DIAGNOSIS — Z79899 Other long term (current) drug therapy: Secondary | ICD-10-CM | POA: Diagnosis not present

## 2017-11-18 DIAGNOSIS — Z96642 Presence of left artificial hip joint: Secondary | ICD-10-CM | POA: Diagnosis not present

## 2017-11-18 DIAGNOSIS — Z419 Encounter for procedure for purposes other than remedying health state, unspecified: Secondary | ICD-10-CM

## 2017-11-18 DIAGNOSIS — D62 Acute posthemorrhagic anemia: Secondary | ICD-10-CM | POA: Diagnosis not present

## 2017-11-18 DIAGNOSIS — Z8546 Personal history of malignant neoplasm of prostate: Secondary | ICD-10-CM

## 2017-11-18 DIAGNOSIS — Z923 Personal history of irradiation: Secondary | ICD-10-CM

## 2017-11-18 DIAGNOSIS — Z9849 Cataract extraction status, unspecified eye: Secondary | ICD-10-CM

## 2017-11-18 DIAGNOSIS — Z471 Aftercare following joint replacement surgery: Secondary | ICD-10-CM | POA: Diagnosis not present

## 2017-11-18 DIAGNOSIS — K219 Gastro-esophageal reflux disease without esophagitis: Secondary | ICD-10-CM | POA: Diagnosis present

## 2017-11-18 DIAGNOSIS — Z96641 Presence of right artificial hip joint: Secondary | ICD-10-CM | POA: Diagnosis not present

## 2017-11-18 HISTORY — PX: TOTAL HIP ARTHROPLASTY: SHX124

## 2017-11-18 LAB — TYPE AND SCREEN
ABO/RH(D): O POS
Antibody Screen: NEGATIVE

## 2017-11-18 SURGERY — ARTHROPLASTY, HIP, TOTAL, ANTERIOR APPROACH
Anesthesia: General | Site: Hip | Laterality: Left

## 2017-11-18 SURGERY — ARTHROPLASTY, HIP, TOTAL, ANTERIOR APPROACH
Anesthesia: Spinal | Site: Hip | Laterality: Left

## 2017-11-18 MED ORDER — DEXAMETHASONE SODIUM PHOSPHATE 10 MG/ML IJ SOLN
INTRAMUSCULAR | Status: DC | PRN
  Administered 2017-11-18: 10 mg via INTRAVENOUS

## 2017-11-18 MED ORDER — FENTANYL CITRATE (PF) 100 MCG/2ML IJ SOLN
INTRAMUSCULAR | Status: AC
  Filled 2017-11-18: qty 2

## 2017-11-18 MED ORDER — SODIUM CHLORIDE 0.9 % IJ SOLN
INTRAMUSCULAR | Status: DC | PRN
  Administered 2017-11-18: 30 mL

## 2017-11-18 MED ORDER — BUPIVACAINE LIPOSOME 1.3 % IJ SUSP
INTRAMUSCULAR | Status: DC | PRN
  Administered 2017-11-18: 20 mL

## 2017-11-18 MED ORDER — ONDANSETRON HCL 4 MG/2ML IJ SOLN: 4.0000 mg | Freq: Four times a day (QID) | INTRAMUSCULAR | Status: DC | PRN

## 2017-11-18 MED ORDER — CEFAZOLIN SODIUM-DEXTROSE 2-4 GM/100ML-% IV SOLN
2.0000 g | Freq: Four times a day (QID) | INTRAVENOUS | Status: AC
  Administered 2017-11-18 (×2): 2 g via INTRAVENOUS
  Filled 2017-11-18 (×2): qty 100

## 2017-11-18 MED ORDER — DOCUSATE SODIUM 100 MG PO CAPS: 100.0000 mg | ORAL_CAPSULE | Freq: Two times a day (BID) | ORAL | 0 refills | Status: DC

## 2017-11-18 MED ORDER — HYDROCODONE-ACETAMINOPHEN 5-325 MG PO TABS
1.0000 | ORAL_TABLET | ORAL | Status: DC | PRN
  Administered 2017-11-18: 1 via ORAL
  Filled 2017-11-18: qty 2

## 2017-11-18 MED ORDER — HYDROCODONE-ACETAMINOPHEN 10-325 MG PO TABS: 1.0000 | ORAL_TABLET | ORAL | 0 refills | Status: DC | PRN

## 2017-11-18 MED ORDER — MAGNESIUM CITRATE PO SOLN: 1.0000 | Freq: Once | ORAL | Status: DC | PRN

## 2017-11-18 MED ORDER — CELECOXIB 200 MG PO CAPS
200.0000 mg | ORAL_CAPSULE | Freq: Two times a day (BID) | ORAL | Status: DC
  Administered 2017-11-18 – 2017-11-19 (×2): 200 mg via ORAL
  Filled 2017-11-18 (×2): qty 1

## 2017-11-18 MED ORDER — PROPOFOL 10 MG/ML IV BOLUS
INTRAVENOUS | Status: DC | PRN
  Administered 2017-11-18: 100 mg via INTRAVENOUS
  Administered 2017-11-18 (×2): 10 mg via INTRAVENOUS
  Administered 2017-11-18: 20 mg via INTRAVENOUS
  Administered 2017-11-18: 50 mg via INTRAVENOUS
  Administered 2017-11-18 (×3): 20 mg via INTRAVENOUS

## 2017-11-18 MED ORDER — LACTATED RINGERS IV SOLN
INTRAVENOUS | Status: DC
  Administered 2017-11-18 (×3): via INTRAVENOUS

## 2017-11-18 MED ORDER — 0.9 % SODIUM CHLORIDE (POUR BTL) OPTIME
TOPICAL | Status: DC | PRN
  Administered 2017-11-18: 1000 mL

## 2017-11-18 MED ORDER — TRANEXAMIC ACID-NACL 1000-0.7 MG/100ML-% IV SOLN
1000.0000 mg | Freq: Once | INTRAVENOUS | Status: AC
  Administered 2017-11-18: 1000 mg via INTRAVENOUS
  Filled 2017-11-18: qty 100

## 2017-11-18 MED ORDER — METHOCARBAMOL 500 MG IVPB - SIMPLE MED
INTRAVENOUS | Status: AC
  Filled 2017-11-18: qty 50

## 2017-11-18 MED ORDER — DEXAMETHASONE SODIUM PHOSPHATE 10 MG/ML IJ SOLN
10.0000 mg | Freq: Two times a day (BID) | INTRAMUSCULAR | Status: DC
  Administered 2017-11-18 – 2017-11-19 (×2): 10 mg via INTRAVENOUS
  Filled 2017-11-18 (×2): qty 1

## 2017-11-18 MED ORDER — MIDAZOLAM HCL 5 MG/5ML IJ SOLN
INTRAMUSCULAR | Status: DC | PRN
  Administered 2017-11-18: 2 mg via INTRAVENOUS

## 2017-11-18 MED ORDER — GABAPENTIN 300 MG PO CAPS
300.0000 mg | ORAL_CAPSULE | Freq: Two times a day (BID) | ORAL | Status: DC
  Administered 2017-11-18 – 2017-11-19 (×2): 300 mg via ORAL
  Filled 2017-11-18 (×2): qty 1

## 2017-11-18 MED ORDER — LATANOPROST 0.005 % OP SOLN
1.0000 [drp] | Freq: Every day | OPHTHALMIC | Status: DC
  Administered 2017-11-18: 1 [drp] via OPHTHALMIC
  Filled 2017-11-18: qty 2.5

## 2017-11-18 MED ORDER — FENTANYL CITRATE (PF) 100 MCG/2ML IJ SOLN
25.0000 ug | INTRAMUSCULAR | Status: DC | PRN
  Administered 2017-11-18 (×2): 50 ug via INTRAVENOUS

## 2017-11-18 MED ORDER — DOCUSATE SODIUM 100 MG PO CAPS
100.0000 mg | ORAL_CAPSULE | Freq: Two times a day (BID) | ORAL | Status: DC
  Administered 2017-11-18 – 2017-11-19 (×2): 100 mg via ORAL
  Filled 2017-11-18 (×2): qty 1

## 2017-11-18 MED ORDER — TRANEXAMIC ACID 1000 MG/10ML IV SOLN: 1.5000 mg/kg/h | INTRAVENOUS | Status: DC

## 2017-11-18 MED ORDER — METHOCARBAMOL 500 MG PO TABS
500.0000 mg | ORAL_TABLET | Freq: Four times a day (QID) | ORAL | Status: DC | PRN
  Administered 2017-11-18 – 2017-11-19 (×4): 500 mg via ORAL
  Filled 2017-11-18 (×4): qty 1

## 2017-11-18 MED ORDER — HYDROCODONE-ACETAMINOPHEN 7.5-325 MG PO TABS: 1.0000 | ORAL_TABLET | ORAL | Status: DC | PRN

## 2017-11-18 MED ORDER — ONDANSETRON HCL 4 MG/2ML IJ SOLN
INTRAMUSCULAR | Status: DC | PRN
  Administered 2017-11-18: 4 mg via INTRAVENOUS

## 2017-11-18 MED ORDER — BUPIVACAINE LIPOSOME 1.3 % IJ SUSP
10.0000 mL | Freq: Once | INTRAMUSCULAR | Status: DC
  Filled 2017-11-18: qty 20

## 2017-11-18 MED ORDER — SODIUM CHLORIDE 0.9 % IV SOLN
INTRAVENOUS | Status: DC
  Administered 2017-11-18: 100 mL/h via INTRAVENOUS
  Administered 2017-11-19: 01:00:00 via INTRAVENOUS

## 2017-11-18 MED ORDER — ASPIRIN EC 325 MG PO TBEC: 325.0000 mg | DELAYED_RELEASE_TABLET | Freq: Two times a day (BID) | ORAL | 0 refills | Status: DC

## 2017-11-18 MED ORDER — ASPIRIN EC 325 MG PO TBEC
325.0000 mg | DELAYED_RELEASE_TABLET | Freq: Two times a day (BID) | ORAL | Status: DC
  Administered 2017-11-19: 325 mg via ORAL
  Filled 2017-11-18: qty 1

## 2017-11-18 MED ORDER — STERILE WATER FOR IRRIGATION IR SOLN
Status: DC | PRN
  Administered 2017-11-18: 3000 mL

## 2017-11-18 MED ORDER — TRANEXAMIC ACID-NACL 1000-0.7 MG/100ML-% IV SOLN
1000.0000 mg | INTRAVENOUS | Status: AC
  Administered 2017-11-18: 1000 mg via INTRAVENOUS
  Filled 2017-11-18 (×2): qty 100

## 2017-11-18 MED ORDER — ROCURONIUM BROMIDE 10 MG/ML (PF) SYRINGE
PREFILLED_SYRINGE | INTRAVENOUS | Status: AC
  Filled 2017-11-18: qty 10

## 2017-11-18 MED ORDER — DIPHENHYDRAMINE HCL 12.5 MG/5ML PO ELIX
12.5000 mg | ORAL_SOLUTION | ORAL | Status: DC | PRN
  Administered 2017-11-19: 25 mg via ORAL
  Filled 2017-11-18: qty 10

## 2017-11-18 MED ORDER — CEFAZOLIN SODIUM-DEXTROSE 2-4 GM/100ML-% IV SOLN
2.0000 g | Freq: Once | INTRAVENOUS | Status: AC
  Administered 2017-11-18: 2 g via INTRAVENOUS
  Filled 2017-11-18: qty 100

## 2017-11-18 MED ORDER — TIZANIDINE HCL 2 MG PO TABS: 2.0000 mg | ORAL_TABLET | Freq: Three times a day (TID) | ORAL | 0 refills | Status: DC | PRN

## 2017-11-18 MED ORDER — ONDANSETRON HCL 4 MG/2ML IJ SOLN
INTRAMUSCULAR | Status: AC
  Filled 2017-11-18: qty 2

## 2017-11-18 MED ORDER — SUGAMMADEX SODIUM 200 MG/2ML IV SOLN
INTRAVENOUS | Status: AC
  Filled 2017-11-18: qty 2

## 2017-11-18 MED ORDER — MORPHINE SULFATE (PF) 2 MG/ML IV SOLN: 0.5000 mg | INTRAVENOUS | Status: DC | PRN

## 2017-11-18 MED ORDER — DEXAMETHASONE SODIUM PHOSPHATE 10 MG/ML IJ SOLN
INTRAMUSCULAR | Status: AC
  Filled 2017-11-18: qty 1

## 2017-11-18 MED ORDER — PHENYLEPHRINE 40 MCG/ML (10ML) SYRINGE FOR IV PUSH (FOR BLOOD PRESSURE SUPPORT)
PREFILLED_SYRINGE | INTRAVENOUS | Status: DC | PRN
  Administered 2017-11-18: 80 ug via INTRAVENOUS

## 2017-11-18 MED ORDER — ROCURONIUM BROMIDE 50 MG/5ML IV SOSY
PREFILLED_SYRINGE | INTRAVENOUS | Status: DC | PRN
  Administered 2017-11-18: 50 mg via INTRAVENOUS

## 2017-11-18 MED ORDER — ALUM & MAG HYDROXIDE-SIMETH 200-200-20 MG/5ML PO SUSP: 30.0000 mL | ORAL | Status: DC | PRN

## 2017-11-18 MED ORDER — MIDAZOLAM HCL 2 MG/2ML IJ SOLN
INTRAMUSCULAR | Status: AC
  Filled 2017-11-18: qty 2

## 2017-11-18 MED ORDER — METHOCARBAMOL 500 MG IVPB - SIMPLE MED
500.0000 mg | Freq: Four times a day (QID) | INTRAVENOUS | Status: DC | PRN
  Administered 2017-11-18: 500 mg via INTRAVENOUS
  Filled 2017-11-18: qty 50

## 2017-11-18 MED ORDER — ACETAMINOPHEN 325 MG PO TABS
325.0000 mg | ORAL_TABLET | Freq: Four times a day (QID) | ORAL | Status: DC | PRN
  Administered 2017-11-18 – 2017-11-19 (×4): 650 mg via ORAL
  Filled 2017-11-18 (×4): qty 2

## 2017-11-18 MED ORDER — SUGAMMADEX SODIUM 200 MG/2ML IV SOLN
INTRAVENOUS | Status: DC | PRN
  Administered 2017-11-18: 200 mg via INTRAVENOUS

## 2017-11-18 MED ORDER — PHENYLEPHRINE 40 MCG/ML (10ML) SYRINGE FOR IV PUSH (FOR BLOOD PRESSURE SUPPORT)
PREFILLED_SYRINGE | INTRAVENOUS | Status: AC
  Filled 2017-11-18: qty 10

## 2017-11-18 MED ORDER — ONDANSETRON HCL 4 MG PO TABS: 4.0000 mg | ORAL_TABLET | Freq: Four times a day (QID) | ORAL | Status: DC | PRN

## 2017-11-18 MED ORDER — SILDENAFIL CITRATE 20 MG PO TABS: 20.0000 mg | ORAL_TABLET | Freq: Every day | ORAL | Status: DC | PRN

## 2017-11-18 MED ORDER — POLYETHYLENE GLYCOL 3350 17 G PO PACK
17.0000 g | PACK | Freq: Every day | ORAL | Status: DC | PRN
  Administered 2017-11-19: 17 g via ORAL
  Filled 2017-11-18: qty 1

## 2017-11-18 MED ORDER — FENTANYL CITRATE (PF) 100 MCG/2ML IJ SOLN
INTRAMUSCULAR | Status: DC | PRN
  Administered 2017-11-18 (×6): 50 ug via INTRAVENOUS

## 2017-11-18 MED ORDER — BISACODYL 5 MG PO TBEC: 5.0000 mg | DELAYED_RELEASE_TABLET | Freq: Every day | ORAL | Status: DC | PRN

## 2017-11-18 MED ORDER — BUPIVACAINE HCL (PF) 0.5 % IJ SOLN
INTRAMUSCULAR | Status: DC | PRN
  Administered 2017-11-18: 20 mL

## 2017-11-18 MED ORDER — PROPOFOL 10 MG/ML IV BOLUS
INTRAVENOUS | Status: AC
  Filled 2017-11-18: qty 60

## 2017-11-18 SURGICAL SUPPLY — 40 items
BAG ZIPLOCK 12X15 (MISCELLANEOUS) IMPLANT
BENZOIN TINCTURE PRP APPL 2/3 (GAUZE/BANDAGES/DRESSINGS) ×3 IMPLANT
BLADE SAW SGTL 18X1.27X75 (BLADE) ×2 IMPLANT
BLADE SAW SGTL 18X1.27X75MM (BLADE) ×1
CLOSURE WOUND 1/2 X4 (GAUZE/BANDAGES/DRESSINGS) ×1
COVER PERINEAL POST (MISCELLANEOUS) ×3 IMPLANT
COVER SURGICAL LIGHT HANDLE (MISCELLANEOUS) ×3 IMPLANT
COVER WAND RF STERILE (DRAPES) ×3 IMPLANT
DRAPE STERI IOBAN 125X83 (DRAPES) ×3 IMPLANT
DRAPE U-SHAPE 47X51 STRL (DRAPES) ×6 IMPLANT
DRSG AQUACEL AG ADV 3.5X 6 (GAUZE/BANDAGES/DRESSINGS) ×3 IMPLANT
DURAPREP 26ML APPLICATOR (WOUND CARE) ×3 IMPLANT
ELECT REM PT RETURN 15FT ADLT (MISCELLANEOUS) ×3 IMPLANT
ELIMINATOR HOLE APEX DEPUY (Hips) ×3 IMPLANT
GAUZE XEROFORM 1X8 LF (GAUZE/BANDAGES/DRESSINGS) ×3 IMPLANT
GLOVE BIOGEL PI IND STRL 7.0 (GLOVE) ×2 IMPLANT
GLOVE BIOGEL PI IND STRL 8 (GLOVE) ×2 IMPLANT
GLOVE BIOGEL PI INDICATOR 7.0 (GLOVE) ×4
GLOVE BIOGEL PI INDICATOR 8 (GLOVE) ×4
GLOVE ECLIPSE 7.5 STRL STRAW (GLOVE) ×6 IMPLANT
GOWN STRL REUS W/TWL XL LVL3 (GOWN DISPOSABLE) ×6 IMPLANT
HEAD CERAMIC 36 PLUS 8.5 12 14 (Hips) ×3 IMPLANT
HOLDER FOLEY CATH W/STRAP (MISCELLANEOUS) ×3 IMPLANT
HOOD PEEL AWAY FLYTE STAYCOOL (MISCELLANEOUS) ×6 IMPLANT
LINER NEUTRAL 58X36MM PLUS4 ×6 IMPLANT
NEEDLE HYPO 22GX1.5 SAFETY (NEEDLE) ×3 IMPLANT
PACK ANTERIOR HIP CUSTOM (KITS) ×3 IMPLANT
PIN SECT CUP 60MM (Hips) ×3 IMPLANT
SLEEVE SURGEON STRL (DRAPES) ×3 IMPLANT
STAPLER VISISTAT 35W (STAPLE) IMPLANT
STEM AMT HIGH CORAIL SZ13X135 (Stem) ×3 IMPLANT
STRIP CLOSURE SKIN 1/2X4 (GAUZE/BANDAGES/DRESSINGS) ×2 IMPLANT
SUT ETHIBOND NAB CT1 #1 30IN (SUTURE) ×6 IMPLANT
SUT MNCRL AB 3-0 PS2 18 (SUTURE) IMPLANT
SUT VIC AB 0 CT1 36 (SUTURE) ×6 IMPLANT
SUT VIC AB 1 CT1 36 (SUTURE) ×3 IMPLANT
SUT VIC AB 2-0 CT1 27 (SUTURE) ×2
SUT VIC AB 2-0 CT1 TAPERPNT 27 (SUTURE) ×1 IMPLANT
TRAY FOLEY MTR SLVR 16FR STAT (SET/KITS/TRAYS/PACK) ×3 IMPLANT
YANKAUER SUCT BULB TIP 10FT TU (MISCELLANEOUS) ×3 IMPLANT

## 2017-11-18 NOTE — Transfer of Care (Signed)
Immediate Anesthesia Transfer of Care Note  Patient: Brandon Dyer  Procedure(s) Performed: TOTAL HIP ARTHROPLASTY ANTERIOR APPROACH (Left Hip)  Patient Location: PACU  Anesthesia Type:General  Level of Consciousness: drowsy and patient cooperative  Airway & Oxygen Therapy: Patient Spontanous Breathing and Patient connected to face mask oxygen  Post-op Assessment: Report given to RN and Post -op Vital signs reviewed and stable  Post vital signs: Reviewed and stable  Last Vitals:  Vitals Value Taken Time  BP 137/86 11/18/2017  1:17 PM  Temp    Pulse 69 11/18/2017  1:20 PM  Resp 14 11/18/2017  1:20 PM  SpO2 99 % 11/18/2017  1:20 PM  Vitals shown include unvalidated device data.  Last Pain:  Vitals:   11/18/17 0800  TempSrc:   PainSc: 3       Patients Stated Pain Goal: 3 (16/94/50 3888)  Complications: No apparent anesthesia complications

## 2017-11-18 NOTE — Anesthesia Preprocedure Evaluation (Addendum)
Anesthesia Evaluation  Patient identified by MRN, date of birth, ID band Patient awake    Reviewed: Allergy & Precautions, NPO status , Patient's Chart, lab work & pertinent test results  Airway Mallampati: I  TM Distance: >3 FB Neck ROM: Full    Dental no notable dental hx. (+) Teeth Intact, Dental Advisory Given   Pulmonary    Pulmonary exam normal breath sounds clear to auscultation       Cardiovascular Normal cardiovascular exam Rhythm:Regular Rate:Normal     Neuro/Psych negative neurological ROS  negative psych ROS   GI/Hepatic Neg liver ROS, GERD  Medicated,  Endo/Other  negative endocrine ROS  Renal/GU negative Renal ROS  negative genitourinary   Musculoskeletal  (+) Arthritis , Osteoarthritis,    Abdominal   Peds  Hematology negative hematology ROS (+)   Anesthesia Other Findings   Reproductive/Obstetrics                            Anesthesia Physical Anesthesia Plan  ASA: II  Anesthesia Plan: Spinal   Post-op Pain Management:    Induction: Intravenous  PONV Risk Score and Plan: 1 and Treatment may vary due to age or medical condition  Airway Management Planned: Natural Airway and Simple Face Mask  Additional Equipment:   Intra-op Plan:   Post-operative Plan:   Informed Consent: I have reviewed the patients History and Physical, chart, labs and discussed the procedure including the risks, benefits and alternatives for the proposed anesthesia with the patient or authorized representative who has indicated his/her understanding and acceptance.   Dental advisory given  Plan Discussed with: CRNA  Anesthesia Plan Comments:         Anesthesia Quick Evaluation

## 2017-11-18 NOTE — Anesthesia Procedure Notes (Signed)
Procedure Name: Intubation Date/Time: 11/18/2017 10:33 AM Performed by: Montel Clock, CRNA Pre-anesthesia Checklist: Patient identified, Emergency Drugs available, Suction available, Patient being monitored and Timeout performed Patient Re-evaluated:Patient Re-evaluated prior to induction Oxygen Delivery Method: Circle system utilized Preoxygenation: Pre-oxygenation with 100% oxygen Induction Type: IV induction Ventilation: Mask ventilation without difficulty and Oral airway inserted - appropriate to patient size Laryngoscope Size: 3 and Glidescope Grade View: Grade I Tube type: Parker flex tip Tube size: 7.5 mm Number of attempts: 1 Airway Equipment and Method: Video-laryngoscopy and Rigid stylet Placement Confirmation: ETT inserted through vocal cords under direct vision,  positive ETCO2 and breath sounds checked- equal and bilateral Secured at: 25 cm Tube secured with: Tape Dental Injury: Teeth and Oropharynx as per pre-operative assessment  Comments: Elective glidescope on first attempt r/t difficulty on prior procedure. Easy mask with 10 OA. ETT easily passed though cords. Cords noted to be deep.

## 2017-11-18 NOTE — Brief Op Note (Signed)
11/18/2017  1:26 PM  PATIENT:  Brandon Dyer  72 y.o. male  PRE-OPERATIVE DIAGNOSIS:  left hip arthritis  POST-OPERATIVE DIAGNOSIS:  left hip arthritis  PROCEDURE:  Procedure(s): TOTAL HIP ARTHROPLASTY ANTERIOR APPROACH (Left)  SURGEON:  Surgeon(s) and Role:    Dorna Leitz, MD - Primary  PHYSICIAN ASSISTANT:   ASSISTANTS: jim bethune   ANESTHESIA:   general  EBL:  650 mL   BLOOD ADMINISTERED:none  DRAINS: none   LOCAL MEDICATIONS USED:  MARCAINE    and OTHER experel  SPECIMEN:  No Specimen  DISPOSITION OF SPECIMEN:  N/A  COUNTS:  YES  TOURNIQUET:  * No tourniquets in log *  DICTATION: .Other Dictation: Dictation Number [id number]  PLAN OF CARE: Admit to inpatient   PATIENT DISPOSITION:  PACU - hemodynamically stable.   Delay start of Pharmacological VTE agent (>24hrs) due to surgical blood loss or risk of bleeding: no

## 2017-11-18 NOTE — Discharge Instructions (Signed)

## 2017-11-18 NOTE — Op Note (Signed)
NAME: Brandon Dyer, MAPEL MEDICAL RECORD AO:[id number] ACCOUNT [id number] DATE OF BIRTH:16-Mar-1945 FACILITY: [facility] LOCATION: [facility] PHYSICIAN:Jorey Dollard L. Kaison Mcparland, MD  OPERATIVE REPORT  DATE OF PROCEDURE:  11/18/2017  PREOPERATIVE DIAGNOSIS:  End-stage degenerative joint disease, left hip, with severe bone-on-bone change.  POSTOPERATIVE DIAGNOSIS:  End-stage degenerative joint disease, left hip, with severe bone-on-bone change.  PROCEDURE:   1.  Left total hip replacement with a Corail size 13 high offset stem, a 60 mm Pinnacle cup with a +4 neutral liner, 36 mm delta ceramic hip ball +8. 2.  Interpretation of multiple intraoperative fluoroscopic images.  SURGEON:  Dorna Leitz, MD  ASSISTANT:  Gaspar Skeeters, PA  ANESTHESIA:  General.  BRIEF HISTORY:  The patient is a 72 year old male with a long history of significant complaints of left hip pain that had been treated conservatively for a prolonged period of time.  X-ray showed severe bone-on-bone change.  He had no motion in the hip  and after failure of all conservative care, is taken to the operating room for left total hip replacement.  The patient was having night pain and light activity pain and basically had no motion of his hip.  DESCRIPTION OF PROCEDURE:  The patient was taken to the operating room after adequate anesthesia was obtained with general anesthetic, the patient was placed supine on the operating table and moved onto the Hana bed.  At that point boots had been placed.   All bony prominences were well padded.  Attention was then turned to the left hip where after routine prep and drape, an incision was made for an anterior approach to the hip subcutaneous tissue down to the level of the hip itself.  The capsule was  opened and tagged.  A provisional neck cut was made.  Retractors were put in place above and below the acetabulum.  The acetabulum was sequentially reamed up to a level of 59 mm and a 60 mm Pinnacle cup was  hammered into place 45 degrees of lateral  opening, 30 degrees of anteversion.  Once that completed, attention was turned to the stem side.  The stem was opened and sequentially rasped up to a level of 12, +0 ball was placed and the hip was reduced.  It looked quite good on the AP hip, but in  strange and external rotation, the hip was dislocating at 30 degrees of external rotation.  At this point, I thought that the posterior capsule was what was tight.  We released the posterior capsule and then came down onto a super large inferior  osteophyte that went around posterior as well.  Once we released the osteophyte at this point then we had a much better fit and fill and rotation.  We were able to extend to 60 and rotate to 80 with a hip staying in place.  At this point, we brought the  hip back up, dislocated it, took out the size 12.  We were a little bit short in our length as well, so I went up to a 13 and then took the 13 stem high offset and put in place.  Once that was completed, I put a +8 and got [place] to the symmetric leg lengths  so we used that.  At that point, we irrigated the hip thoroughly and closed the capsule with #1 Vicryl running.  The tensor fascia was closed with 0 Vicryl running, skin with 0 and 2-0 Vicryl and 3-0 Monocryl subcuticular.  Fluoroscopic images were  taken multiple times intraoperatively.  Gaspar Skeeters, PA, assisted throughout the case and was critical in his performance by making retraction and then also closing to minimize OR time.     The estimated blood loss for procedure was about 500 mL.  The final be gotten from the anesthetic record.  TN/NUANCE  D:11/18/2017 T:11/18/2017 JOB:[id number]

## 2017-11-18 NOTE — Evaluation (Signed)
Physical Therapy Evaluation Patient Details Name: Brandon Dyer MRN: [id number] DOB: [birth year] Today's Date: 11/18/2017   History of Present Illness  72 yo male s/p L DA-THA on 11/08/17. PMH includes epidermal cyst removal from midback 10/2017, inguinal hernia and repair, cancer 2013, GERD, cataracts.    Clinical Impression  Pt presents with mild L hip pain, difficulty performing bed mobility/transfers, unsteadiness in standing. Pt to benefit from acute PT to address deficits. PT deferred ambulation today due to pt's instability in standing, mild dizziness. BP and HR WNL. PT to progress mobility as tolerated, and will continue to follow acutely.   Of note, pt's continuous pulse ox was not reading at end of session. PT left pt unplugged from the machine and notified NT of this. NT to fix it.     Follow Up Recommendations Follow surgeon's recommendation for DC plan and follow-up therapies;Supervision for mobility/OOB    Equipment Recommendations  Rolling walker with 5" wheels    Recommendations for Other Services       Precautions / Restrictions Precautions Precautions: Fall Restrictions Weight Bearing Restrictions: No Other Position/Activity Restrictions: WBAT      Mobility  Bed Mobility Overal bed mobility: Needs Assistance Bed Mobility: Supine to Sit;Sit to Supine     Supine to sit: Min assist;HOB elevated Sit to supine: Min assist;HOB elevated   General bed mobility comments: Min assist for sit<>supine for LE management, verbal cuing provided throughout for sequencing. Pt with mild dizziness upon sitting EOB, BP and HR 115/71, 83 bpm. Pt's dizziness passed.   Transfers Overall transfer level: Needs assistance Equipment used: Rolling walker (2 wheeled) Transfers: Sit to/from Stand Sit to Stand: Min guard;From elevated surface         General transfer comment: Min guard for safety. Pt with unsteadiness in standing during pregait activities of weight shifting and  marching. Pt encouraged to sit down due to this.   Ambulation/Gait Ambulation/Gait assistance: (Not attempted due to instability in standing)              Stairs            Wheelchair Mobility    Modified Rankin (Stroke Patients Only)       Balance Overall balance assessment: Needs assistance Sitting-balance support: No upper extremity supported Sitting balance-Leahy Scale: Good     Standing balance support: Bilateral upper extremity supported Standing balance-Leahy Scale: Poor Standing balance comment: relies on RW, unsteady in standing on eval                              Pertinent Vitals/Pain Pain Assessment: 0-10 Pain Score: 3  Pain Location: L hip  Pain Descriptors / Indicators: Sore Pain Intervention(s): Limited activity within patient's tolerance;Monitored during session;Repositioned;Ice applied    Home Living Family/patient expects to be discharged to:: Private residence Living Arrangements: Spouse/significant other Available Help at Discharge: Family;Available PRN/intermittently Type of Home: House Home Access: Stairs to enter Entrance Stairs-Rails: Chemical engineer of Steps: 2 Home Layout: One level Home Equipment: None      Prior Function Level of Independence: Independent               Hand Dominance   Dominant Hand: Right    Extremity/Trunk Assessment   Upper Extremity Assessment Upper Extremity Assessment: Overall WFL for tasks assessed    Lower Extremity Assessment Lower Extremity Assessment: Overall WFL for tasks assessed;LLE deficits/detail LLE Deficits / Details: suspected post-surgical hip weakness; able  to perform quad set, SLR with assist, ankle pumps LLE Sensation: WNL    Cervical / Trunk Assessment Cervical / Trunk Assessment: Normal  Communication   Communication: No difficulties  Cognition Arousal/Alertness: Awake/alert Behavior During Therapy: WFL for tasks  assessed/performed Overall Cognitive Status: Within Functional Limits for tasks assessed                                        General Comments      Exercises Total Joint Exercises Ankle Circles/Pumps: AROM;Both;5 reps;Supine   Assessment/Plan    PT Assessment Patient needs continued PT services  PT Problem List Decreased strength;Pain;Decreased range of motion;Decreased activity tolerance;Decreased knowledge of use of DME;Decreased balance;Decreased safety awareness;Decreased mobility       PT Treatment Interventions DME instruction;Therapeutic activities;Gait training;Therapeutic exercise;Patient/family education;Balance training;Stair training;Functional mobility training    PT Goals (Current goals can be found in the Care Plan section)  Acute Rehab PT Goals Patient Stated Goal: walk  PT Goal Formulation: With patient Time For Goal Achievement: 11/25/17 Potential to Achieve Goals: Good    Frequency 7X/week   Barriers to discharge        Co-evaluation               AM-PAC PT "6 Clicks" Daily Activity  Outcome Measure Difficulty turning over in bed (including adjusting bedclothes, sheets and blankets)?: Unable Difficulty moving from lying on back to sitting on the side of the bed? : Unable Difficulty sitting down on and standing up from a chair with arms (e.g., wheelchair, bedside commode, etc,.)?: Unable Help needed moving to and from a bed to chair (including a wheelchair)?: A Little Help needed walking in hospital room?: A Little Help needed climbing 3-5 steps with a railing? : A Little 6 Click Score: 12    End of Session Equipment Utilized During Treatment: Gait belt Activity Tolerance: Patient tolerated treatment well;Other (comment)(Pt limited by unsteadiness in standing and dizziness) Patient left: in bed;with bed alarm set;with call bell/phone within reach;with family/visitor present Nurse Communication: Mobility status PT Visit  Diagnosis: Unsteadiness on feet (R26.81);Other abnormalities of gait and mobility (R26.89)    Time: 1810-1840 PT Time Calculation (min) (ACUTE ONLY): 30 min   Charges:   PT Evaluation $PT Eval Low Complexity: 1 Low PT Treatments $Therapeutic Activity: 8-22 mins       Julien Girt, PT Acute Rehabilitation Services Pager [phone]  Office [phone]   Skyeler Scalese D Elonda Husky 11/18/2017, 7:57 PM

## 2017-11-18 NOTE — H&P (Addendum)
TOTAL HIP ADMISSION H&P  Patient is admitted for left total hip arthroplasty.  Subjective:  Chief Complaint: left hip pain  HPI: Brandon Dyer, 72 y.o. male, has a history of pain and functional disability in the left hip(s) due to arthritis and patient has failed non-surgical conservative treatments for greater than 12 weeks to include NSAID's and/or analgesics, corticosteriod injections, weight reduction as appropriate and activity modification.  Onset of symptoms was gradual starting 6 years ago with gradually worsening course since that time.The patient noted no past surgery on the left hip(s).  Patient currently rates pain in the left hip at 9 out of 10 with activity. Patient has night pain, worsening of pain with activity and weight bearing, trendelenberg gait, pain that interfers with activities of daily living and joint swelling. Patient has evidence of subchondral sclerosis, periarticular osteophytes, joint subluxation and joint space narrowing by imaging studies. This condition presents safety issues increasing the risk of falls. This patient has had Failure of all reasonable conservative care.  There is no current active infection.  Patient Active Problem List   Diagnosis Date Noted  . Epidermal cyst 10/30/2017  . Inguinal hernia unilateral, non-recurrent, left 10/18/2011   Past Medical History:  Diagnosis Date  . Cancer ([place]) 12-01-11   prostate- dx. 2008-Radiation tx.  . Epidermal cyst    on back  . GERD (gastroesophageal reflux disease)    occ. -uses OTC med-PRILOSEC    Past Surgical History:  Procedure Laterality Date  . APPENDECTOMY    . CATARACT EXTRACTION    . cataract surgery     one eye  . CYSTECTOMY     on back  . HERNIA REPAIR  12/06/11   LIH repair with mesh  . INGUINAL HERNIA REPAIR  12/06/2011   Procedure: HERNIA REPAIR INGUINAL ADULT;  Surgeon: Earnstine Regal, MD;  [facility];  Service: General;  Laterality: Left;  Repair Left Inguinal Hernia with  Mesh  . INSERTION OF MESH  12/06/2011   Procedure: INSERTION OF MESH;  Surgeon: Earnstine Regal, MD;  Location: [facility];  Service: General;  Laterality: Left;  Marland Kitchen MASS EXCISION N/A 11/01/2017   Procedure: EXCISION OF EPIDERMAL CYSTS 2 FROM MID BACK;  Surgeon: Armandina Gemma, MD;  Location: [facility];  Service: General;  Laterality: N/A;    No current facility-administered medications for this encounter.    Current Outpatient Medications  Medication Sig Dispense Refill Last Dose  . acetaminophen (TYLENOL) 500 MG tablet Take 500 mg by mouth every 6 (six) hours as needed for moderate pain.     Marland Kitchen latanoprost (XALATAN) 0.005 % ophthalmic solution Place 1 drop into both eyes at bedtime.  11   . sildenafil (REVATIO) 20 MG tablet Take 20 mg by mouth daily as needed (ED).     . traMADol (ULTRAM) 50 MG tablet Take 1-2 tablets (50-100 mg total) by mouth every 6 (six) hours as needed for moderate pain. (Patient not taking: Reported on 11/03/2017) 12 tablet 0 Not Taking at Unknown time   No Known Allergies  Social History   Tobacco Use  . Smoking status: Never Smoker  . Smokeless tobacco: Never Used  Substance Use Topics  . Alcohol use: Yes    Comment: occ. social    No family history on file.   ROS ROS: I have reviewed the patient's review of systems thoroughly and there are no positive responses as relates to the HPI. Objective:  Physical Exam  Vital signs in  last 24 hours:    Vitals:   11/18/17 0744  BP: (!) 152/94  Pulse: 78  Resp: 18  Temp: 97.8 F (36.6 C)  SpO2: 97%    Well-developed well-nourished patient in no acute distress. Alert and oriented x3 HEENT:within normal limits Cardiac: Regular rate and rhythm Pulmonary: Lungs clear to auscultation Abdomen: Soft and nontender.  Normal active bowel sounds  Musculoskeletal: (Left hip: Limited range of motion.  Painful range of motion.  No instability.  Neurovascular intact distally. Labs: Recent Results (from the  past 2160 hour(s))  Surgical pcr screen     Status: None   Collection Time: 11/10/17  4:19 PM  Result Value Ref Range   MRSA, PCR NEGATIVE NEGATIVE   Staphylococcus aureus NEGATIVE NEGATIVE    Comment: (NOTE) The Xpert SA Assay (FDA approved for NASAL specimens in patients 70 years of age and older), is one component of a comprehensive surveillance program. It is not intended to diagnose infection nor to guide or monitor treatment. Performed at [facility], [place] [address] [place], [place] [address]   APTT     Status: None   Collection Time: 11/10/17  4:19 PM  Result Value Ref Range   aPTT 32 24 - 36 seconds    Comment: Performed at [facility], [place] [address], [place], [place] [address]  CBC WITH DIFFERENTIAL     Status: None   Collection Time: 11/10/17  4:19 PM  Result Value Ref Range   WBC 5.3 4.0 - 10.5 K/uL   RBC 4.39 4.22 - 5.81 MIL/uL   Hemoglobin 13.5 13.0 - 17.0 g/dL   HCT 42.2 39.0 - 52.0 %   MCV 96.1 80.0 - 100.0 fL   MCH 30.8 26.0 - 34.0 pg   MCHC 32.0 30.0 - 36.0 g/dL   RDW 13.1 11.5 - 15.5 %   Platelets 171 150 - 400 K/uL   nRBC 0.0 0.0 - 0.2 %   Neutrophils Relative % 60 %   Neutro Abs 3.1 1.7 - 7.7 K/uL   Lymphocytes Relative 27 %   Lymphs Abs 1.4 0.7 - 4.0 K/uL   Monocytes Relative 10 %   Monocytes Absolute 0.5 0.1 - 1.0 K/uL   Eosinophils Relative 2 %   Eosinophils Absolute 0.1 0.0 - 0.5 K/uL   Basophils Relative 1 %   Basophils Absolute 0.1 0.0 - 0.1 K/uL   Immature Granulocytes 0 %   Abs Immature Granulocytes 0.02 0.00 - 0.07 K/uL    Comment: Performed at [facility], [place] [address], [place], [place] [address]  Comprehensive metabolic panel     Status: None   Collection Time: 11/10/17  4:19 PM  Result Value Ref Range   Sodium 142 135 - 145 mmol/L   Potassium 4.3 3.5 - 5.1 mmol/L   Chloride 107 98 - 111 mmol/L   CO2 27 22 - 32 mmol/L   Glucose, Bld 92 70 - 99 mg/dL   BUN 18 8 - 23 mg/dL    Creatinine, Ser 1.07 0.61 - 1.24 mg/dL   Calcium 9.5 8.9 - 10.3 mg/dL   Total Protein 6.9 6.5 - 8.1 g/dL   Albumin 4.0 3.5 - 5.0 g/dL   AST 19 15 - 41 U/L   ALT 12 0 - 44 U/L   Alkaline Phosphatase 64 38 - 126 U/L   Total Bilirubin 0.7 0.3 - 1.2 mg/dL   GFR calc non Af Amer >60 >60 mL/min   GFR calc  Af Amer >60 >60 mL/min    Comment: (NOTE) The eGFR has been calculated using the CKD EPI equation. This calculation has not been validated in all clinical situations. eGFR's persistently <60 mL/min signify possible Chronic Kidney Disease.    Anion gap 8 5 - 15    Comment: Performed at [facility], [place] [address], [place], [place] [address]  Protime-INR     Status: None   Collection Time: 11/10/17  4:19 PM  Result Value Ref Range   Prothrombin Time 12.7 11.4 - 15.2 seconds   INR 0.96     Comment: Performed at [facility], [place] [address], [place], [place] [address]  Type and screen Order type and screen if day of surgery is less than 15 days from draw of preadmission visit or order morning of surgery if day of surgery is greater than 6 days from preadmission visit.     Status: None   Collection Time: 11/10/17  4:19 PM  Result Value Ref Range   ABO/RH(D) O POS    Antibody Screen NEG    Sample Expiration 11/24/2017    Extend sample reason      NO TRANSFUSIONS OR PREGNANCY IN THE PAST 3 MONTHS Performed at [facility], [place] [address], [place], [place] [address]   Urinalysis, Routine w reflex microscopic     Status: None   Collection Time: 11/10/17  4:19 PM  Result Value Ref Range   Color, Urine YELLOW YELLOW   APPearance CLEAR CLEAR   Specific Gravity, Urine 1.020 1.005 - 1.030   pH 6.0 5.0 - 8.0   Glucose, UA NEGATIVE NEGATIVE mg/dL   Hgb urine dipstick NEGATIVE NEGATIVE   Bilirubin Urine NEGATIVE NEGATIVE   Ketones, ur NEGATIVE NEGATIVE mg/dL   Protein, ur NEGATIVE NEGATIVE mg/dL   Nitrite NEGATIVE NEGATIVE    Leukocytes, UA NEGATIVE NEGATIVE    Comment: Performed at [place] [address], [place], [place] [address]  ABO/Rh     Status: None   Collection Time: 11/10/17  4:19 PM  Result Value Ref Range   ABO/RH(D)      O POS Performed at [facility], [place] [address], [place], [place] [address]     Estimated body mass index is 29.4 kg/m as calculated from the following:   Height as of 11/10/17: 6' 3"  (1.905 m).   Weight as of 11/10/17: 106.7 kg.   Imaging Review Plain radiographs demonstrate severe degenerative joint disease of the left hip(s). The bone quality appears to be fair for age and reported activity level.    Preoperative templating of the joint replacement has been completed, documented, and submitted to the Operating Room personnel in order to optimize intra-operative equipment management.     Assessment/Plan:  End stage arthritis, left hip(s)  The patient history, physical examination, clinical judgement of the provider and imaging studies are consistent with end stage degenerative joint disease of the left hip(s) and total hip arthroplasty is deemed medically necessary. The treatment options including medical management, injection therapy, arthroscopy and arthroplasty were discussed at length. The risks and benefits of total hip arthroplasty were presented and reviewed. The risks due to aseptic loosening, infection, stiffness, dislocation/subluxation,  thromboembolic complications and other imponderables were discussed.  The patient acknowledged the explanation, agreed to proceed with the plan and consent was signed. Patient is being admitted for inpatient treatment for surgery, pain control, PT, OT, prophylactic antibiotics, VTE prophylaxis, progressive ambulation and ADL's  and discharge planning.The patient is planning to be discharged home with home health services

## 2017-11-19 LAB — CBC
HCT: 34.7 % — ABNORMAL LOW (ref 39.0–52.0)
HEMOGLOBIN: 11.2 g/dL — AB (ref 13.0–17.0)
MCH: 31 pg (ref 26.0–34.0)
MCHC: 32.3 g/dL (ref 30.0–36.0)
MCV: 96.1 fL (ref 80.0–100.0)
NRBC: 0 % (ref 0.0–0.2)
PLATELETS: 147 10*3/uL — AB (ref 150–400)
RBC: 3.61 MIL/uL — AB (ref 4.22–5.81)
RDW: 13.4 % (ref 11.5–15.5)
WBC: 9.3 10*3/uL (ref 4.0–10.5)

## 2017-11-19 NOTE — Plan of Care (Signed)
  Problem: Clinical Measurements: Goal: Ability to maintain clinical measurements within normal limits will improve Outcome: Progressing Goal: Will remain free from infection Outcome: Progressing Goal: Diagnostic test results will improve Outcome: Progressing Goal: Respiratory complications will improve Outcome: Progressing Goal: Cardiovascular complication will be avoided Outcome: Progressing   Problem: Activity: Goal: Risk for activity intolerance will decrease Outcome: Progressing   Problem: Nutrition: Goal: Adequate nutrition will be maintained Outcome: Progressing   Problem: Coping: Goal: Level of anxiety will decrease Outcome: Progressing   Problem: Elimination: Goal: Will not experience complications related to bowel motility Outcome: Progressing Goal: Will not experience complications related to urinary retention Outcome: Progressing   Problem: Pain Managment: Goal: General experience of comfort will improve Outcome: Progressing   Problem: Skin Integrity: Goal: Risk for impaired skin integrity will decrease Outcome: Progressing   Problem: Education: Goal: Knowledge of the prescribed therapeutic regimen will improve Outcome: Progressing Goal: Understanding of discharge needs will improve Outcome: Progressing Goal: Individualized Educational Video(s) Outcome: Progressing   Problem: Activity: Goal: Ability to avoid complications of mobility impairment will improve Outcome: Progressing Goal: Ability to tolerate increased activity will improve Outcome: Progressing   Problem: Clinical Measurements: Goal: Postoperative complications will be avoided or minimized Outcome: Progressing   Problem: Pain Management: Goal: Pain level will decrease with appropriate interventions Outcome: Progressing   Problem: Skin Integrity: Goal: Will show signs of wound healing Outcome: Progressing

## 2017-11-19 NOTE — Care Management Note (Signed)
Case Management Note  Patient Details  Name: Brandon Dyer MRN: [id number] Date of Birth: December 25, 1945  Subjective/Objective: 72 yo M, s/p L DA-THA                   Action/Plan: HH and DME   Expected Discharge Date:                  Expected Discharge Plan:  [place]  In-House Referral:     Discharge planning Services  CM Consult  Post Acute Care Choice:  Home Health Choice offered to:  Patient, Spouse  DME Arranged:  Walker rolling DME Agency:  [place]:  PT [place] Agency:  [facility] (formerly [facility]  Status of Service:  Completed, signed off  If discussed at [employer], dates discussed:    Additional Comments: Met with pt and granddaughter. Pt plans to return home with the support of his wife and family. He needs a RW. Discussed preference for a [facility] agency with pt and with wife via phone. Informed them that the referral was made to [facility] prior to the surgery. They agreed to use [facility]. Contacted Reggie at [facility] for DME referral.  Norina Buzzard, RN 11/19/2017, 2:39 PM

## 2017-11-19 NOTE — Progress Notes (Signed)
Physical Therapy Treatment Patient Details Name: Brandon Dyer MRN: [id number] DOB: Jul 25, 1945 Today's Date: 11/19/2017    History of Present Illness 72 yo male s/p L DA-THA on 11/08/17. PMH includes epidermal cyst removal from midback 10/2017, inguinal hernia and repair, cancer 2013, GERD, cataracts.    PT Comments    Received page from RN "pt is going home today, can you come back and practices stairs"  With pt. Spouse present and observed all mobility esp stairs.  Also instructed on HEP and use of ICE.  Pt is at a ssfe mobility level to D/C to home.    Follow Up Recommendations  Follow surgeon's recommendation for DC plan and follow-up therapies;Supervision for mobility/OOB(HH PT)     Equipment Recommendations  Rolling walker with 5" wheels    Recommendations for Other Services       Precautions / Restrictions Precautions Precautions: Fall Restrictions Weight Bearing Restrictions: No Other Position/Activity Restrictions: WBAT    Mobility  Bed Mobility Overal bed mobility: Needs Assistance Bed Mobility: Supine to Sit;Sit to Supine     Supine to sit: Min assist;HOB elevated Sit to supine: Min assist;HOB elevated   General bed mobility comments: demonstrated and instructed how to use a gait belt to self assist L LE off and onto bed.  Pt performed with increased time.  Transfers Overall transfer level: Needs assistance Equipment used: Rolling walker (2 wheeled) Transfers: Sit to/from [facility] Sit to Stand: Supervision;Min guard Stand pivot transfers: Supervision;Min guard       General transfer comment: with spouse in room to observe  Ambulation/Gait Ambulation/Gait assistance: Min guard Gait Distance (Feet): 22 Feet(to stairs ) Assistive device: Rolling walker (2 wheeled) Gait Pattern/deviations: Step-to pattern;Step-through pattern;Decreased stance time - left Gait velocity: decreased    General Gait Details: with spouse close by for  instruction on safe handling and proper use.     Stairs Stairs: Yes Stairs assistance: Supervision;Min guard Stair Management: Two rails;Forwards Number of Stairs: 2 General stair comments: 50% VC's on proper walker placement at top of stairs, proper sequencing and safety.  Performed twice.    Wheelchair Mobility    Modified Rankin (Stroke Patients Only)       Balance                                            Cognition Arousal/Alertness: Awake/alert Behavior During Therapy: WFL for tasks assessed/performed Overall Cognitive Status: Within Functional Limits for tasks assessed                                        Exercises      General Comments        Pertinent Vitals/Pain Pain Assessment: 0-10 Pain Score: 3  Pain Location: L hip  Pain Descriptors / Indicators: Sore;Tightness;Operative site guarding;Tender Pain Intervention(s): Monitored during session;Premedicated before session;Repositioned;Ice applied    Home Living                      Prior Function            PT Goals (current goals can now be found in the care plan section) Progress towards PT goals: Progressing toward goals    Frequency    7X/week      PT Plan Current  plan remains appropriate    Co-evaluation              AM-PAC PT "6 Clicks" Daily Activity  Outcome Measure  Difficulty turning over in bed (including adjusting bedclothes, sheets and blankets)?: Unable Difficulty moving from lying on back to sitting on the side of the bed? : Unable Difficulty sitting down on and standing up from a chair with arms (e.g., wheelchair, bedside commode, etc,.)?: Unable Help needed moving to and from a bed to chair (including a wheelchair)?: A Little Help needed walking in hospital room?: A Little Help needed climbing 3-5 steps with a railing? : A Little 6 Click Score: 12    End of Session Equipment Utilized During Treatment: Gait  belt Activity Tolerance: Patient tolerated treatment well Patient left: in bed;with bed alarm set;with call bell/phone within reach;with family/visitor present Nurse Communication: Mobility status PT Visit Diagnosis: Unsteadiness on feet (R26.81);Other abnormalities of gait and mobility (R26.89)     Time: 9450-3888 PT Time Calculation (min) (ACUTE ONLY): 12 min  Charges:  $Gait Training: 8-22 mins                     Rica Koyanagi  PTA Acute  Rehabilitation Services Pager      [phone] Office      [phone]

## 2017-11-19 NOTE — Progress Notes (Signed)
Physical Therapy Treatment Patient Details Name: Brandon Dyer MRN: [id number] DOB: 11/10/1945 Today's Date: 11/19/2017    History of Present Illness 72 yo male s/p L DA-THA on 11/08/17. PMH includes epidermal cyst removal from midback 10/2017, inguinal hernia and repair, cancer 2013, GERD, cataracts.    PT Comments    POD # 1 am session demonstrated and instructed how to use a gait belt to self assist L LE off and onto bed.  Pt performed with increased time. Assisted OOB to amb in hallway with walker.  25% VC's on proper walker to self distance and 50% VC's safety with turns.  Mild tremor/unsteady.  Assisted back to bed to perform all supine THR TE's following HEP handout.  Instructed on proper tech, freq as well as use of ICE.    Follow Up Recommendations  Follow surgeon's recommendation for DC plan and follow-up therapies;Supervision for mobility/OOB(HH PT)     Equipment Recommendations  Rolling walker with 5" wheels    Recommendations for Other Services       Precautions / Restrictions Precautions Precautions: Fall Restrictions Weight Bearing Restrictions: No Other Position/Activity Restrictions: WBAT    Mobility  Bed Mobility Overal bed mobility: Needs Assistance Bed Mobility: Supine to Sit;Sit to Supine     Supine to sit: Min assist;HOB elevated Sit to supine: Min assist;HOB elevated   General bed mobility comments: demonstrated and instructed how to use a gait belt to self assist L LE off and onto bed.  Pt performed with increased time.  Transfers Overall transfer level: Needs assistance Equipment used: Rolling walker (2 wheeled) Transfers: Sit to/from [facility] Sit to Stand: Min guard;From elevated surface         General transfer comment: 50% VC's on safety with turns and hand placement with stand to sit  Ambulation/Gait Ambulation/Gait assistance: Min guard Gait Distance (Feet): 55 Feet Assistive device: Rolling walker (2  wheeled) Gait Pattern/deviations: Step-to pattern;Step-through pattern;Decreased stance time - left Gait velocity: decreased    General Gait Details: 25% VC's on proper walker to self distance and 50% VC's safety with turns.  Mild tremor/unsteady.    Stairs             Wheelchair Mobility    Modified Rankin (Stroke Patients Only)       Balance                                            Cognition Arousal/Alertness: Awake/alert Behavior During Therapy: WFL for tasks assessed/performed Overall Cognitive Status: Within Functional Limits for tasks assessed                                        Exercises   Total Hip Replacement TE's 10 reps ankle pumps 10 reps knee presses 10 reps heel slides 10 reps SAQ's 10 reps ABD Followed by ICE     General Comments        Pertinent Vitals/Pain Pain Assessment: 0-10 Pain Score: 3  Pain Location: L hip  Pain Descriptors / Indicators: Sore;Tightness;Operative site guarding;Tender Pain Intervention(s): Monitored during session;Premedicated before session;Repositioned;Ice applied    Home Living                      Prior Function  PT Goals (current goals can now be found in the care plan section) Progress towards PT goals: Progressing toward goals    Frequency    7X/week      PT Plan Current plan remains appropriate    Co-evaluation              AM-PAC PT "6 Clicks" Daily Activity  Outcome Measure  Difficulty turning over in bed (including adjusting bedclothes, sheets and blankets)?: Unable Difficulty moving from lying on back to sitting on the side of the bed? : Unable Difficulty sitting down on and standing up from a chair with arms (e.g., wheelchair, bedside commode, etc,.)?: Unable Help needed moving to and from a bed to chair (including a wheelchair)?: A Little Help needed walking in hospital room?: A Little Help needed climbing 3-5 steps  with a railing? : A Little 6 Click Score: 12    End of Session Equipment Utilized During Treatment: Gait belt Activity Tolerance: Patient tolerated treatment well Patient left: in bed;with bed alarm set;with call bell/phone within reach;with family/visitor present Nurse Communication: Mobility status PT Visit Diagnosis: Unsteadiness on feet (R26.81);Other abnormalities of gait and mobility (R26.89)     Time: 1010-1040 PT Time Calculation (min) (ACUTE ONLY): 30 min  Charges:  $Gait Training: 8-22 mins $Therapeutic Exercise: 8-22 mins                     Rica Koyanagi  PTA Acute  Rehabilitation Services Pager      [phone] Office      [phone]

## 2017-11-19 NOTE — Discharge Summary (Signed)
Patient ID: Brandon Dyer MRN: [id number] DOB/AGE: 1945-06-01 72 y.o.  Admit date: 11/18/2017 Discharge date: 11/19/2017  Admission Diagnoses:  Principal Problem:   Primary osteoarthritis of left hip   Discharge Diagnoses:  Same  Past Medical History:  Diagnosis Date  . Cancer ([place]) 12-01-11   prostate- dx. 2008-Radiation tx.  . Epidermal cyst    on back  . GERD (gastroesophageal reflux disease)    occ. -uses OTC med-PRILOSEC    Surgeries: Procedure(s): TOTAL HIP ARTHROPLASTY ANTERIOR APPROACH on 11/18/2017   Consultants:   Discharged Condition: Improved  Hospital Course: SHAQUON GROPP is an 72 y.o. male who was admitted 11/18/2017 for operative treatment ofPrimary osteoarthritis of left hip. Patient has severe unremitting pain that affects sleep, daily activities, and work/hobbies. After pre-op clearance the patient was taken to the operating room on 11/18/2017 and underwent  Procedure(s): TOTAL HIP ARTHROPLASTY ANTERIOR APPROACH.    Patient was given perioperative antibiotics:  Anti-infectives (From admission, onward)   Start     Dose/Rate Route Frequency Ordered Stop   11/18/17 1600  ceFAZolin (ANCEF) IVPB 2g/100 mL premix     2 g 200 mL/hr over 30 Minutes Intravenous Every 6 hours 11/18/17 1424 11/18/17 2124   11/18/17 0930  ceFAZolin (ANCEF) IVPB 2g/100 mL premix     2 g 200 mL/hr over 30 Minutes Intravenous  Once 11/18/17 0928 11/18/17 1039       Patient was given sequential compression devices, early ambulation, and chemoprophylaxis to prevent DVT.  Patient benefited maximally from hospital stay and there were no complications.    Recent vital signs:  Patient Vitals for the past 24 hrs:  BP Temp Temp src Pulse Resp SpO2  11/19/17 1410 131/71 98.3 F (36.8 C) Oral 74 16 96 %  11/19/17 0910 118/67 98.3 F (36.8 C) Oral 75 16 96 %  11/19/17 0523 102/65 98.6 F (37 C) Oral 83 no documentation 97 %  11/19/17 0116 106/68 98.8 F (37.1 C) Oral 89 no  documentation 96 %  11/18/17 2133 108/68 98.8 F (37.1 C) Oral 93 16 97 %  11/18/17 1731 (Abnormal) 155/90 98.4 F (36.9 C) Oral 77 no documentation 99 %  11/18/17 1647 (Abnormal) 149/92 97.8 F (36.6 C) Oral 70 14 100 %     Recent laboratory studies:  Recent Labs    11/19/17 0311  WBC 9.3  HGB 11.2*  HCT 34.7*  PLT 147*     Discharge Medications:   Allergies as of 11/19/2017   No Known Allergies     Medication List    Stop taking these medications   acetaminophen 500 MG tablet Commonly known as:  TYLENOL   traMADol 50 MG tablet Commonly known as:  ULTRAM     Take these medications   aspirin EC 325 MG tablet Take 1 tablet (325 mg total) by mouth 2 (two) times daily after a meal. Take x 1 month post op to decrease risk of blood clots.   docusate sodium 100 MG capsule Commonly known as:  COLACE Take 1 capsule (100 mg total) by mouth 2 (two) times daily.   HYDROcodone-acetaminophen 10-325 MG tablet Commonly known as:  NORCO Take 1 tablet by mouth every 4 (four) hours as needed for severe pain.   latanoprost 0.005 % ophthalmic solution Commonly known as:  XALATAN Place 1 drop into both eyes at bedtime.   sildenafil 20 MG tablet Commonly known as:  REVATIO Take 20 mg by mouth daily as needed (ED).   tiZANidine  2 MG tablet Commonly known as:  ZANAFLEX Take 1 tablet (2 mg total) by mouth every 8 (eight) hours as needed for muscle spasms.        Durable Medical Equipment  (From admission, onward)         Start     Ordered   11/19/17 1203  For home use only DME Walker rolling  Once    Question:  Patient needs a walker to treat with the following condition  Answer:  Status post hip replacement   11/19/17 1205          Diagnostic Studies: Dg Chest 2 View  Result Date: 11/11/2017 CLINICAL DATA:  72 year old male with a history of preop chest x-ray EXAM: CHEST - 2 VIEW COMPARISON:  None. FINDINGS: Cardiomediastinal silhouette within normal limits.  Asymmetric elevation of the right hemidiaphragm with no comparison available. No pleural effusion. No pneumothorax. No confluent airspace disease. Degenerative changes of the spine without displaced fracture. IMPRESSION: Negative for acute cardiopulmonary disease, with asymmetric elevation of the right hemidiaphragm. Electronically Signed   By: Corrie Mckusick D.O.   On: 11/11/2017 08:25   Dg C-arm 1-60 Min-no Report  Result Date: 11/18/2017 Fluoroscopy was utilized by the requesting physician.  No radiographic interpretation.   Dg Hip Port Unilat With Pelvis 1v Left  Result Date: 11/18/2017 CLINICAL DATA:  Status post left hip replacement earlier today - post op image EXAM: DG HIP (WITH OR WITHOUT PELVIS) 1V PORT LEFT COMPARISON:  Intraoperative images today FINDINGS: Status post LEFT total hip arthroplasty. The hardware appears well seated. There is no evidence for dislocation on the frontal view. Soft tissue gas is identified around the hip consistent with interval surgery. IMPRESSION: Status post LEFT hip arthroplasty.  No adverse features. Electronically Signed   By: Nolon Nations M.D.   On: 11/18/2017 15:43   Dg Hip Operative Unilat With Pelvis Left  Result Date: 11/18/2017 CLINICAL DATA:  Left upper arthroplasty. EXAM: OPERATIVE LEFT HIP (WITH PELVIS IF PERFORMED) 5 VIEWS TECHNIQUE: Fluoroscopic spot image(s) were submitted for interpretation post-operatively. COMPARISON:  None. FINDINGS: Five spot intraoperative images of the left hip demonstrate placement of left total hip arthroplasty. Small space between the medial aspect of the proximal femoral stem and adjacent trochanteric bone. Findings otherwise unremarkable. IMPRESSION: Changes as described post left total hip arthroplasty. Electronically Signed   By: Marin Olp M.D.   On: 11/18/2017 13:52    Disposition: Discharge disposition: 01-Home or Self Care       Discharge Instructions    Call MD / Call 911   Complete by:  As  directed    If you experience chest pain or shortness of breath, CALL 911 and be transported to the hospital emergency room.  If you develope a fever above 101 F, pus (white drainage) or increased drainage or redness at the wound, or calf pain, call your surgeon's office.   Constipation Prevention   Complete by:  As directed    Drink plenty of fluids.  Prune juice may be helpful.  You may use a stool softener, such as Colace (over the counter) 100 mg twice a day.  Use MiraLax (over the counter) for constipation as needed.   Diet - low sodium heart healthy   Complete by:  As directed    Increase activity slowly as tolerated   Complete by:  As directed       Follow-up Information    Dorna Leitz, MD. Schedule an appointment as soon as possible  for a visit in 10 days.   Specialty:  Orthopedic Surgery Contact information: [place] [address] [phone]        [facility]   Specialty:  [place]:  [facility] will provide home health physical therapy Contact information: [place] [address] [phone]            Signed: Kerin Salen 11/19/2017, 3:54 PM

## 2017-11-19 NOTE — Progress Notes (Signed)
Physical Therapy Treatment Patient Details Name: Brandon Dyer MRN: [id number] DOB: 1946/01/11 Today's Date: 11/19/2017    History of Present Illness 72 yo male s/p L DA-THA on 11/08/17. PMH includes epidermal cyst removal from midback 10/2017, inguinal hernia and repair, cancer 2013, GERD, cataracts.    PT Comments    POD #1 pm session Assisted pt OOB to amb a greater distance in hallway then assisted back to bed to rest per pt request.   Applied and ICE and positioned to comfort.  Pt plans to D/C to home tomorrow per RN.   Follow Up Recommendations  Follow surgeon's recommendation for DC plan and follow-up therapies;Supervision for mobility/OOB(HH PT)     Equipment Recommendations  Rolling walker with 5" wheels    Recommendations for Other Services       Precautions / Restrictions Precautions Precautions: Fall Restrictions Weight Bearing Restrictions: No Other Position/Activity Restrictions: WBAT    Mobility  Bed Mobility Overal bed mobility: Needs Assistance Bed Mobility: Supine to Sit;Sit to Supine     Supine to sit: Min assist;HOB elevated Sit to supine: Min assist;HOB elevated   General bed mobility comments: demonstrated and instructed how to use a gait belt to self assist L LE off and onto bed.  Pt performed with increased time.  Transfers Overall transfer level: Needs assistance Equipment used: Rolling walker (2 wheeled) Transfers: Sit to/from [facility] Sit to Stand: Min guard;From elevated surface         General transfer comment: 50% VC's on safety with turns and hand placement with stand to sit  Ambulation/Gait Ambulation/Gait assistance: Min guard Gait Distance (Feet): 85 Feet Assistive device: Rolling walker (2 wheeled) Gait Pattern/deviations: Step-to pattern;Step-through pattern;Decreased stance time - left Gait velocity: decreased    General Gait Details: 25% VC's on proper walker to self distance and 50% VC's safety  with turns.  Mild tremor/unsteady slight improvement.  Tolerated an increased distance.   Stairs             Wheelchair Mobility    Modified Rankin (Stroke Patients Only)       Balance                                            Cognition Arousal/Alertness: Awake/alert Behavior During Therapy: WFL for tasks assessed/performed Overall Cognitive Status: Within Functional Limits for tasks assessed                                        Exercises      General Comments        Pertinent Vitals/Pain Pain Assessment: 0-10 Pain Score: 3  Pain Location: L hip  Pain Descriptors / Indicators: Sore;Tightness;Operative site guarding;Tender Pain Intervention(s): Monitored during session;Premedicated before session;Repositioned;Ice applied    Home Living                      Prior Function            PT Goals (current goals can now be found in the care plan section) Progress towards PT goals: Progressing toward goals    Frequency    7X/week      PT Plan Current plan remains appropriate    Co-evaluation  AM-PAC PT "6 Clicks" Daily Activity  Outcome Measure  Difficulty turning over in bed (including adjusting bedclothes, sheets and blankets)?: Unable Difficulty moving from lying on back to sitting on the side of the bed? : Unable Difficulty sitting down on and standing up from a chair with arms (e.g., wheelchair, bedside commode, etc,.)?: Unable Help needed moving to and from a bed to chair (including a wheelchair)?: A Little Help needed walking in hospital room?: A Little Help needed climbing 3-5 steps with a railing? : A Little 6 Click Score: 12    End of Session Equipment Utilized During Treatment: Gait belt Activity Tolerance: Patient tolerated treatment well Patient left: in bed;with bed alarm set;with call bell/phone within reach;with family/visitor present Nurse Communication: Mobility  status PT Visit Diagnosis: Unsteadiness on feet (R26.81);Other abnormalities of gait and mobility (R26.89)     Time: 1300-1320 PT Time Calculation (min) (ACUTE ONLY): 20 min  Charges:  $Gait Training: 8-22 mins                     Rica Koyanagi  PTA Acute  Rehabilitation Services Pager      [phone] Office      [phone]

## 2017-11-19 NOTE — Progress Notes (Signed)
PATIENT ID: Brandon Dyer  MRN: [id number]  DOB/AGE:  March 20, 1945 / 72 y.o.  1 Day Post-Op Procedure(s) (LRB): TOTAL HIP ARTHROPLASTY ANTERIOR APPROACH (Left)    PROGRESS NOTE Subjective: Patient is alert, oriented, no Nausea, no Vomiting, yes passing gas, . Taking PO well. Denies SOB, Chest or Calf Pain. Using Incentive Spirometer, PAS in place. Ambulate became dizzy when he stood up yesterday but feels he will do much better today Patient reports pain as 1/10, patient did straight leg in bed today  Objective: Vital signs in last 24 hours: Vitals:   11/18/17 1731 11/18/17 2133 11/19/17 0116 11/19/17 0523  BP: (Abnormal) 155/90 108/68 106/68 102/65  Pulse: 77 93 89 83  Resp:  16    Temp: 98.4 F (36.9 C) 98.8 F (37.1 C) 98.8 F (37.1 C) 98.6 F (37 C)  TempSrc: Oral Oral Oral Oral  SpO2: 99% 97% 96% 97%  Weight:      Height:          Intake/Output from previous day: I/O last 3 completed shifts: In: 61 [P.O.:780; I.V.:4071; IV Piggyback:55] Out: 2970 [Urine:2320; Blood:650]   Intake/Output this shift: No intake/output data recorded.   LABORATORY DATA: Recent Labs    11/19/17 0311  WBC 9.3  HGB 11.2*  HCT 34.7*  PLT 147*    Examination: Neurologically intact ABD soft Neurovascular intact Sensation intact distally Intact pulses distally Dorsiflexion/Plantar flexion intact Incision: dressing C/D/I No cellulitis present Compartment soft} XR AP&Lat of hip shows well placed\fixed THA  Assessment:   1 Day Post-Op Procedure(s) (LRB): TOTAL HIP ARTHROPLASTY ANTERIOR APPROACH (Left) ADDITIONAL DIAGNOSIS:  Expected Acute Blood Loss Anemia,   Plan: PT/OT WBAT, THA  DVT Prophylaxis: SCDx72 hrs, ASA 81 mg BID x 2 weeks  DISCHARGE PLAN: Home  DISCHARGE NEEDS: HHPT, Walker and 3-in-1 comode seatPatient ID: Brandon Dyer, male   DOB: 1945/04/26, 72 y.o.   MRN: [id number]

## 2017-11-20 NOTE — Anesthesia Postprocedure Evaluation (Signed)
Anesthesia Post Note  Patient: Brandon Dyer  Procedure(s) Performed: TOTAL HIP ARTHROPLASTY ANTERIOR APPROACH (Left Hip)     Patient location during evaluation: PACU Anesthesia Type: General Level of consciousness: awake and alert Pain management: pain level controlled Vital Signs Assessment: post-procedure vital signs reviewed and stable Respiratory status: spontaneous breathing, nonlabored ventilation, respiratory function stable and patient connected to nasal cannula oxygen Cardiovascular status: blood pressure returned to baseline and stable Postop Assessment: no apparent nausea or vomiting Anesthetic complications: no    Last Vitals:  Vitals:   11/19/17 0910 11/19/17 1410  BP: 118/67 131/71  Pulse: 75 74  Resp: 16 16  Temp: 36.8 C 36.8 C  SpO2: 96% 96%    Last Pain:  Vitals:   11/19/17 1515  TempSrc:   PainSc: 4                  Malachi Suderman L Chaunce Winkels

## 2017-11-21 ENCOUNTER — Encounter (HOSPITAL_COMMUNITY): Payer: Self-pay | Admitting: Orthopedic Surgery

## 2017-11-21 DIAGNOSIS — Z9181 History of falling: Secondary | ICD-10-CM | POA: Diagnosis not present

## 2017-11-21 DIAGNOSIS — Z96642 Presence of left artificial hip joint: Secondary | ICD-10-CM | POA: Diagnosis not present

## 2017-11-21 DIAGNOSIS — Z7982 Long term (current) use of aspirin: Secondary | ICD-10-CM | POA: Diagnosis not present

## 2017-11-21 DIAGNOSIS — Z8546 Personal history of malignant neoplasm of prostate: Secondary | ICD-10-CM | POA: Diagnosis not present

## 2017-11-21 DIAGNOSIS — Z471 Aftercare following joint replacement surgery: Secondary | ICD-10-CM | POA: Diagnosis not present

## 2017-11-21 DIAGNOSIS — K4090 Unilateral inguinal hernia, without obstruction or gangrene, not specified as recurrent: Secondary | ICD-10-CM | POA: Diagnosis not present

## 2017-11-21 NOTE — Addendum Note (Signed)
Addendum  created 11/21/17 4742 by Lollie Sails, CRNA   Charge Capture section accepted

## 2017-11-23 DIAGNOSIS — K4090 Unilateral inguinal hernia, without obstruction or gangrene, not specified as recurrent: Secondary | ICD-10-CM | POA: Diagnosis not present

## 2017-11-23 DIAGNOSIS — Z9181 History of falling: Secondary | ICD-10-CM | POA: Diagnosis not present

## 2017-11-23 DIAGNOSIS — Z8546 Personal history of malignant neoplasm of prostate: Secondary | ICD-10-CM | POA: Diagnosis not present

## 2017-11-23 DIAGNOSIS — Z96642 Presence of left artificial hip joint: Secondary | ICD-10-CM | POA: Diagnosis not present

## 2017-11-23 DIAGNOSIS — Z471 Aftercare following joint replacement surgery: Secondary | ICD-10-CM | POA: Diagnosis not present

## 2017-11-23 DIAGNOSIS — Z7982 Long term (current) use of aspirin: Secondary | ICD-10-CM | POA: Diagnosis not present

## 2017-11-25 DIAGNOSIS — Z8546 Personal history of malignant neoplasm of prostate: Secondary | ICD-10-CM | POA: Diagnosis not present

## 2017-11-25 DIAGNOSIS — Z9181 History of falling: Secondary | ICD-10-CM | POA: Diagnosis not present

## 2017-11-25 DIAGNOSIS — K4090 Unilateral inguinal hernia, without obstruction or gangrene, not specified as recurrent: Secondary | ICD-10-CM | POA: Diagnosis not present

## 2017-11-25 DIAGNOSIS — Z96642 Presence of left artificial hip joint: Secondary | ICD-10-CM | POA: Diagnosis not present

## 2017-11-25 DIAGNOSIS — Z7982 Long term (current) use of aspirin: Secondary | ICD-10-CM | POA: Diagnosis not present

## 2017-11-25 DIAGNOSIS — Z471 Aftercare following joint replacement surgery: Secondary | ICD-10-CM | POA: Diagnosis not present

## 2017-11-28 DIAGNOSIS — Z8546 Personal history of malignant neoplasm of prostate: Secondary | ICD-10-CM | POA: Diagnosis not present

## 2017-11-28 DIAGNOSIS — Z471 Aftercare following joint replacement surgery: Secondary | ICD-10-CM | POA: Diagnosis not present

## 2017-11-28 DIAGNOSIS — Z7982 Long term (current) use of aspirin: Secondary | ICD-10-CM | POA: Diagnosis not present

## 2017-11-28 DIAGNOSIS — Z96642 Presence of left artificial hip joint: Secondary | ICD-10-CM | POA: Diagnosis not present

## 2017-11-28 DIAGNOSIS — Z9181 History of falling: Secondary | ICD-10-CM | POA: Diagnosis not present

## 2017-11-28 DIAGNOSIS — K4090 Unilateral inguinal hernia, without obstruction or gangrene, not specified as recurrent: Secondary | ICD-10-CM | POA: Diagnosis not present

## 2017-12-01 DIAGNOSIS — K4090 Unilateral inguinal hernia, without obstruction or gangrene, not specified as recurrent: Secondary | ICD-10-CM | POA: Diagnosis not present

## 2017-12-01 DIAGNOSIS — Z96642 Presence of left artificial hip joint: Secondary | ICD-10-CM | POA: Diagnosis not present

## 2017-12-01 DIAGNOSIS — Z471 Aftercare following joint replacement surgery: Secondary | ICD-10-CM | POA: Diagnosis not present

## 2017-12-01 DIAGNOSIS — Z8546 Personal history of malignant neoplasm of prostate: Secondary | ICD-10-CM | POA: Diagnosis not present

## 2017-12-01 DIAGNOSIS — Z7982 Long term (current) use of aspirin: Secondary | ICD-10-CM | POA: Diagnosis not present

## 2017-12-01 DIAGNOSIS — Z9181 History of falling: Secondary | ICD-10-CM | POA: Diagnosis not present

## 2017-12-05 DIAGNOSIS — M1612 Unilateral primary osteoarthritis, left hip: Secondary | ICD-10-CM | POA: Diagnosis not present

## 2017-12-07 DIAGNOSIS — R278 Other lack of coordination: Secondary | ICD-10-CM | POA: Diagnosis not present

## 2017-12-07 DIAGNOSIS — Z96642 Presence of left artificial hip joint: Secondary | ICD-10-CM | POA: Diagnosis not present

## 2017-12-07 DIAGNOSIS — M79605 Pain in left leg: Secondary | ICD-10-CM | POA: Diagnosis not present

## 2017-12-07 DIAGNOSIS — R2681 Unsteadiness on feet: Secondary | ICD-10-CM | POA: Diagnosis not present

## 2017-12-08 DIAGNOSIS — Z961 Presence of intraocular lens: Secondary | ICD-10-CM | POA: Diagnosis not present

## 2017-12-08 DIAGNOSIS — H524 Presbyopia: Secondary | ICD-10-CM | POA: Diagnosis not present

## 2017-12-08 DIAGNOSIS — H401131 Primary open-angle glaucoma, bilateral, mild stage: Secondary | ICD-10-CM | POA: Diagnosis not present

## 2017-12-08 DIAGNOSIS — H25812 Combined forms of age-related cataract, left eye: Secondary | ICD-10-CM | POA: Diagnosis not present

## 2017-12-09 DIAGNOSIS — Z96642 Presence of left artificial hip joint: Secondary | ICD-10-CM | POA: Diagnosis not present

## 2017-12-09 DIAGNOSIS — R278 Other lack of coordination: Secondary | ICD-10-CM | POA: Diagnosis not present

## 2017-12-09 DIAGNOSIS — R2681 Unsteadiness on feet: Secondary | ICD-10-CM | POA: Diagnosis not present

## 2017-12-09 DIAGNOSIS — M79605 Pain in left leg: Secondary | ICD-10-CM | POA: Diagnosis not present

## 2017-12-12 DIAGNOSIS — R278 Other lack of coordination: Secondary | ICD-10-CM | POA: Diagnosis not present

## 2017-12-12 DIAGNOSIS — Z96642 Presence of left artificial hip joint: Secondary | ICD-10-CM | POA: Diagnosis not present

## 2017-12-12 DIAGNOSIS — R2681 Unsteadiness on feet: Secondary | ICD-10-CM | POA: Diagnosis not present

## 2017-12-12 DIAGNOSIS — M79605 Pain in left leg: Secondary | ICD-10-CM | POA: Diagnosis not present

## 2017-12-14 DIAGNOSIS — M79605 Pain in left leg: Secondary | ICD-10-CM | POA: Diagnosis not present

## 2017-12-14 DIAGNOSIS — R2681 Unsteadiness on feet: Secondary | ICD-10-CM | POA: Diagnosis not present

## 2017-12-14 DIAGNOSIS — R278 Other lack of coordination: Secondary | ICD-10-CM | POA: Diagnosis not present

## 2017-12-14 DIAGNOSIS — Z96642 Presence of left artificial hip joint: Secondary | ICD-10-CM | POA: Diagnosis not present

## 2017-12-16 DIAGNOSIS — Z96642 Presence of left artificial hip joint: Secondary | ICD-10-CM | POA: Diagnosis not present

## 2017-12-16 DIAGNOSIS — M79605 Pain in left leg: Secondary | ICD-10-CM | POA: Diagnosis not present

## 2017-12-16 DIAGNOSIS — R278 Other lack of coordination: Secondary | ICD-10-CM | POA: Diagnosis not present

## 2017-12-16 DIAGNOSIS — R2681 Unsteadiness on feet: Secondary | ICD-10-CM | POA: Diagnosis not present

## 2017-12-19 DIAGNOSIS — R278 Other lack of coordination: Secondary | ICD-10-CM | POA: Diagnosis not present

## 2017-12-19 DIAGNOSIS — R2681 Unsteadiness on feet: Secondary | ICD-10-CM | POA: Diagnosis not present

## 2017-12-19 DIAGNOSIS — Z96642 Presence of left artificial hip joint: Secondary | ICD-10-CM | POA: Diagnosis not present

## 2017-12-19 DIAGNOSIS — M79605 Pain in left leg: Secondary | ICD-10-CM | POA: Diagnosis not present

## 2017-12-21 DIAGNOSIS — Z96642 Presence of left artificial hip joint: Secondary | ICD-10-CM | POA: Diagnosis not present

## 2017-12-21 DIAGNOSIS — R278 Other lack of coordination: Secondary | ICD-10-CM | POA: Diagnosis not present

## 2017-12-21 DIAGNOSIS — M79605 Pain in left leg: Secondary | ICD-10-CM | POA: Diagnosis not present

## 2017-12-21 DIAGNOSIS — R2681 Unsteadiness on feet: Secondary | ICD-10-CM | POA: Diagnosis not present

## 2017-12-23 DIAGNOSIS — Z96642 Presence of left artificial hip joint: Secondary | ICD-10-CM | POA: Diagnosis not present

## 2017-12-23 DIAGNOSIS — M79605 Pain in left leg: Secondary | ICD-10-CM | POA: Diagnosis not present

## 2017-12-23 DIAGNOSIS — R278 Other lack of coordination: Secondary | ICD-10-CM | POA: Diagnosis not present

## 2017-12-23 DIAGNOSIS — R2681 Unsteadiness on feet: Secondary | ICD-10-CM | POA: Diagnosis not present

## 2017-12-26 DIAGNOSIS — R1032 Left lower quadrant pain: Secondary | ICD-10-CM | POA: Diagnosis not present

## 2017-12-26 DIAGNOSIS — N4341 Spermatocele of epididymis, single: Secondary | ICD-10-CM | POA: Diagnosis not present

## 2017-12-27 DIAGNOSIS — S56912A Strain of unspecified muscles, fascia and tendons at forearm level, left arm, initial encounter: Secondary | ICD-10-CM | POA: Diagnosis not present

## 2017-12-28 DIAGNOSIS — Z4789 Encounter for other orthopedic aftercare: Secondary | ICD-10-CM | POA: Diagnosis not present

## 2017-12-28 DIAGNOSIS — M79605 Pain in left leg: Secondary | ICD-10-CM | POA: Diagnosis not present

## 2017-12-28 DIAGNOSIS — Z96642 Presence of left artificial hip joint: Secondary | ICD-10-CM | POA: Diagnosis not present

## 2017-12-28 DIAGNOSIS — R2681 Unsteadiness on feet: Secondary | ICD-10-CM | POA: Diagnosis not present

## 2017-12-28 DIAGNOSIS — R278 Other lack of coordination: Secondary | ICD-10-CM | POA: Diagnosis not present

## 2017-12-29 ENCOUNTER — Other Ambulatory Visit: Payer: Self-pay | Admitting: Urology

## 2017-12-30 DIAGNOSIS — Z96642 Presence of left artificial hip joint: Secondary | ICD-10-CM | POA: Diagnosis not present

## 2017-12-30 DIAGNOSIS — R2681 Unsteadiness on feet: Secondary | ICD-10-CM | POA: Diagnosis not present

## 2017-12-30 DIAGNOSIS — Z4789 Encounter for other orthopedic aftercare: Secondary | ICD-10-CM | POA: Diagnosis not present

## 2017-12-30 DIAGNOSIS — M79605 Pain in left leg: Secondary | ICD-10-CM | POA: Diagnosis not present

## 2017-12-30 DIAGNOSIS — R278 Other lack of coordination: Secondary | ICD-10-CM | POA: Diagnosis not present

## 2018-01-11 NOTE — Patient Instructions (Signed)
Brandon Dyer  01/11/2018   Your procedure is scheduled on: 01-24-18   Report to [facility] Main  Entrance    Report to admitting at 7:30AM    Call this number if you have problems the morning of surgery [phone]     Remember: Do not eat food or drink liquids :After Midnight. BRUSH YOUR TEETH MORNING OF SURGERY AND RINSE YOUR MOUTH OUT, NO CHEWING GUM CANDY OR MINTS.     Take these medicines the morning of surgery with A SIP OF WATER: NONE                                You may not have any metal on your body including hair pins and              piercings  Do not wear jewelry, make-up, lotions, powders or perfumes, deodorant                     Men may shave face and neck.   Do not bring valuables to the hospital. [place].  Contacts, dentures or bridgework may not be worn into surgery.       Patients discharged the day of surgery will not be allowed to drive home.  Name and phone number of your driver:  Special Instructions: N/A              Please read over the following fact sheets you were given: _____________________________________________________________________             [facility] - Preparing for Surgery Before surgery, you can play an important role.  Because skin is not sterile, your skin needs to be as free of germs as possible.  You can reduce the number of germs on your skin by washing with CHG (chlorahexidine gluconate) soap before surgery.  CHG is an antiseptic cleaner which kills germs and bonds with the skin to continue killing germs even after washing. Please DO NOT use if you have an allergy to CHG or antibacterial soaps.  If your skin becomes reddened/irritated stop using the CHG and inform your nurse when you arrive at Short Stay. Do not shave (including legs and underarms) for at least 48 hours prior to the first CHG shower.  You may shave your face/neck. Please  follow these instructions carefully:  1.  Shower with CHG Soap the night before surgery and the  morning of Surgery.  2.  If you choose to wash your hair, wash your hair first as usual with your  normal  shampoo.  3.  After you shampoo, rinse your hair and body thoroughly to remove the  shampoo.                           4.  Use CHG as you would any other liquid soap.  You can apply chg directly  to the skin and wash                       Gently with a scrungie or clean washcloth.  5.  Apply the CHG Soap to your body ONLY FROM THE NECK DOWN.   Do not  use on face/ open                           Wound or open sores. Avoid contact with eyes, ears mouth and genitals (private parts).                       Wash face,  Genitals (private parts) with your normal soap.             6.  Wash thoroughly, paying special attention to the area where your surgery  will be performed.  7.  Thoroughly rinse your body with warm water from the neck down.  8.  DO NOT shower/wash with your normal soap after using and rinsing off  the CHG Soap.                9.  Pat yourself dry with a clean towel.            10.  Wear clean pajamas.            11.  Place clean sheets on your bed the night of your first shower and do not  sleep with pets. Day of Surgery : Do not apply any lotions/deodorants the morning of surgery.  Please wear clean clothes to the hospital/surgery center.  FAILURE TO FOLLOW THESE INSTRUCTIONS MAY RESULT IN THE CANCELLATION OF YOUR SURGERY PATIENT SIGNATURE_________________________________  NURSE SIGNATURE__________________________________  ________________________________________________________________________

## 2018-01-11 NOTE — Progress Notes (Signed)
EKG 11-10-17 Epic

## 2018-01-16 ENCOUNTER — Encounter (HOSPITAL_COMMUNITY): Payer: Self-pay

## 2018-01-16 ENCOUNTER — Encounter (HOSPITAL_COMMUNITY)
Admission: RE | Admit: 2018-01-16 | Discharge: 2018-01-16 | Disposition: A | Discharge status: Home / Self Care | Payer: Medicare Other | Source: Ambulatory Visit | Attending: Urology | Admitting: Urology

## 2018-01-16 ENCOUNTER — Other Ambulatory Visit: Payer: Self-pay

## 2018-01-16 DIAGNOSIS — N4340 Spermatocele of epididymis, unspecified: Secondary | ICD-10-CM | POA: Diagnosis not present

## 2018-01-16 DIAGNOSIS — Z01818 Encounter for other preprocedural examination: Secondary | ICD-10-CM | POA: Insufficient documentation

## 2018-01-16 HISTORY — DX: Unspecified glaucoma: H40.9

## 2018-01-16 LAB — CBC
HCT: 40.4 % (ref 39.0–52.0)
Hemoglobin: 12.7 g/dL — ABNORMAL LOW (ref 13.0–17.0)
MCH: 30.3 pg (ref 26.0–34.0)
MCHC: 31.4 g/dL (ref 30.0–36.0)
MCV: 96.4 fL (ref 80.0–100.0)
NRBC: 0 % (ref 0.0–0.2)
PLATELETS: 178 10*3/uL (ref 150–400)
RBC: 4.19 MIL/uL — ABNORMAL LOW (ref 4.22–5.81)
RDW: 14.2 % (ref 11.5–15.5)
WBC: 4.6 10*3/uL (ref 4.0–10.5)

## 2018-01-23 NOTE — H&P (Signed)
CC/HPI: CC: left scrotal mass.   Hx: Brandon Dyer returns today in f/u. He has had his left THR but still has some intermittent discomfort in the groin. He has a 1.5cm cystic lesion at the left external ring. He had a left inguinal hernia repair in 2014 and returned to see Dr. Harlow Asa for some groin pain and he found a 1.5cm lesion just outside the external ring. The lesion was cystic on [place] at his last visit. he has some residual left lateral thigh numbness since the THR but has no other associated signs or symptoms.     CC: AUA Questions Scoring.  HPI: Brandon Dyer is a 72 year-old male established patient who is here for evaluation.      AUA Symptom Score: He never has the sensation of not emptying his bladder completely after finishing urinating. Less than 50% of the time he has to urinate again fewer than two hours after he has finished urinating. He does not have to stop and start again several times when he urinates. Less than 20% of the time he finds it difficult to postpone urination. Less than 50% of the time he has a weak urinary stream. He never has to push or strain to begin urination. He has to get up to urinate 3 times from the time he goes to bed until the time he gets up in the morning.   Calculated AUA Symptom Score: 8    ALLERGIES: No Allergies    MEDICATIONS: Viagra 100 mg tablet 1 tablet PO PRN  Aspirin 81 MG TABS Oral  Claritin TABS Oral     GU PSH: None     PSH Notes: Hernia Repair Inguinal Unilateral, Eye Surgery, Cataract Surgery   NON-GU PSH: None   GU PMH: Spermatocele (single), He has a 1.5cm painful cystic lesion in the left cord that is probably a spermatocele. He is scheduled soon for back surgery and a left hip replacement and will return in 6-8 weeks for reevaluation. If the pain persists then I will remove the lesion but there can frequently be referred pain to the groin from the hip and I want to make sure that is not what is causing the pain. -  10/31/2017 History of prostate cancer, His PSA was falling at the last visit and he will have one today and in a year. - 08/22/2017, History of prostate cancer, - 2017 ED due to arterial insufficiency, Erectile dysfunction due to arterial insufficiency - 2017 Nocturia, Nocturia - 2017 Unil Inguinal Hernia W/O obst or gang,non-recurrent, Left Inguinal Hernia - 2014      PMH Notes:  2012-11-18 05:26:04 - Note: Groin (Inguinal) Pain Left Side  2009-09-01 09:55:05 - Note: Retinal Detachment   NON-GU PMH: Encounter for general adult medical examination without abnormal findings, Encounter for preventive health examination - 2016    FAMILY HISTORY: Family Health Status Number - Runs In Family Prostate Cancer - Father   SOCIAL HISTORY: None    Notes: Never a smoker, Caffeine Use, Marital History - Currently Married, Tobacco Use, Alcohol Use, Occupation:   REVIEW OF SYSTEMS:    GU Review Male:   Patient denies frequent urination, hard to postpone urination, burning/ pain with urination, get up at night to urinate, leakage of urine, stream starts and stops, trouble starting your stream, have to strain to urinate , erection problems, and penile pain.  Gastrointestinal (Upper):   Patient denies indigestion/ heartburn, vomiting, and nausea.  Gastrointestinal (Lower):   Patient denies diarrhea and  constipation.  Constitutional:   Patient denies fever, night sweats, weight loss, and fatigue.  Skin:   Patient denies skin rash/ lesion and itching.  Eyes:   Patient denies blurred vision and double vision.  Ears/ Nose/ Throat:   Patient denies sore throat and sinus problems.  Hematologic/Lymphatic:   Patient denies swollen glands and easy bruising.  Cardiovascular:   Patient denies leg swelling and chest pains.  Respiratory:   Patient denies cough and shortness of breath.  Endocrine:   Patient denies excessive thirst.  Musculoskeletal:   Patient denies back pain and joint pain.  Neurological:   Patient  denies headaches and dizziness.  Psychologic:   Patient denies depression and anxiety.   VITAL SIGNS:      12/26/2017 03:41 PM  Weight 238 lb / 107.95 kg  Height 75 in / 190.5 cm  BP 150/99 mmHg  Pulse 82 /min  Temperature 98.0 F / 36.6 C  BMI 29.7 kg/m   GU PHYSICAL EXAMINATION:    Scrotum: There is a stable mobile 1.5-2cm cyst along the left cord in the sub inguinal area. There is mild associated tenderness.    MULTI-SYSTEM PHYSICAL EXAMINATION:    Constitutional: Well-nourished. No physical deformities. Normally developed. Good grooming.  Respiratory: Normal breath sounds. No labored breathing, no use of accessory muscles.   Cardiovascular: Regular rate and rhythm. No murmur, no gallop.      PAST DATA REVIEWED:  Source Of History:  Patient  Records Review:   AUA Symptom Score  Urine Test Review:   Urinalysis  X-Ray Review: Scrotal Ultrasound: Reviewed Films. Discussed With Patient. I reviewed the lesion on the prior US.     08/22/17 07/23/16 06/03/15 05/21/14 07/30/13 04/30/13 03/30/12 09/23/11  PSA  Total PSA 0.103 ng/mL 0.16 ng/mL 0.18  0.20  0.20  0.34  0.22  0.20     PROCEDURES:          Urinalysis Dipstick Dipstick Cont'd  Color: Yellow Bilirubin: Neg mg/dL  Appearance: Clear Ketones: Neg mg/dL  Specific Gravity: 1.020 Blood: Neg ery/uL  pH: 5.5 Protein: Neg mg/dL  Glucose: Neg mg/dL Urobilinogen: 0.2 mg/dL    Nitrites: Neg    Leukocyte Esterase: Neg leu/uL    ASSESSMENT:      ICD-10 Details  1 GU:   Spermatocele (single) - N43.41 He continues to have pain and has a persistent mildly tender mass at the left external ring which has been found to be cystitic on [place]. I discussed options including observation, aspiration and surgical excision and he would like to have it removed. I discussed the risks of the procedure including bleeding, infection, injury to nerves or the testicular blood supply, anesthetic complications or thrombotic events.   2   LLQ pain -  R10.32    PLAN:           Schedule Return Visit/Planned Activity: Next Available Appointment - Schedule Surgery          Document Letter(s):  Created for Patient: Clinical Summary

## 2018-01-24 ENCOUNTER — Ambulatory Visit (HOSPITAL_COMMUNITY): Payer: Medicare Other | Admitting: Anesthesiology

## 2018-01-24 ENCOUNTER — Encounter (HOSPITAL_COMMUNITY): Payer: Self-pay | Admitting: *Deleted

## 2018-01-24 ENCOUNTER — Encounter (HOSPITAL_COMMUNITY)
Admission: RE | Disposition: A | Discharge status: Home / Self Care | Payer: Self-pay | Source: Home / Self Care | Attending: Urology

## 2018-01-24 ENCOUNTER — Other Ambulatory Visit: Payer: Self-pay

## 2018-01-24 ENCOUNTER — Ambulatory Visit (HOSPITAL_COMMUNITY)
Admission: RE | Admit: 2018-01-24 | Discharge: 2018-01-24 | Disposition: A | Discharge status: Home / Self Care | Payer: Medicare Other | Attending: Urology | Admitting: Urology

## 2018-01-24 DIAGNOSIS — N5201 Erectile dysfunction due to arterial insufficiency: Secondary | ICD-10-CM | POA: Insufficient documentation

## 2018-01-24 DIAGNOSIS — N491 Inflammatory disorders of spermatic cord, tunica vaginalis and vas deferens: Secondary | ICD-10-CM | POA: Diagnosis not present

## 2018-01-24 DIAGNOSIS — Z79899 Other long term (current) drug therapy: Secondary | ICD-10-CM | POA: Insufficient documentation

## 2018-01-24 DIAGNOSIS — Z8546 Personal history of malignant neoplasm of prostate: Secondary | ICD-10-CM | POA: Insufficient documentation

## 2018-01-24 DIAGNOSIS — Z7982 Long term (current) use of aspirin: Secondary | ICD-10-CM | POA: Diagnosis not present

## 2018-01-24 DIAGNOSIS — N4341 Spermatocele of epididymis, single: Secondary | ICD-10-CM | POA: Diagnosis not present

## 2018-01-24 DIAGNOSIS — N4340 Spermatocele of epididymis, unspecified: Secondary | ICD-10-CM | POA: Diagnosis not present

## 2018-01-24 DIAGNOSIS — R1909 Other intra-abdominal and pelvic swelling, mass and lump: Secondary | ICD-10-CM | POA: Diagnosis not present

## 2018-01-24 DIAGNOSIS — Z96642 Presence of left artificial hip joint: Secondary | ICD-10-CM | POA: Diagnosis not present

## 2018-01-24 HISTORY — PX: SPERMATOCELECTOMY: SHX2420

## 2018-01-24 SURGERY — EXCISION, SPERMATOCELE
Anesthesia: General | Site: Inguinal | Laterality: Left

## 2018-01-24 MED ORDER — CEFAZOLIN SODIUM-DEXTROSE 2-4 GM/100ML-% IV SOLN
2.0000 g | INTRAVENOUS | Status: AC
  Administered 2018-01-24: 2 g via INTRAVENOUS
  Filled 2018-01-24: qty 100

## 2018-01-24 MED ORDER — PROPOFOL 10 MG/ML IV BOLUS
INTRAVENOUS | Status: DC | PRN
  Administered 2018-01-24: 200 mg via INTRAVENOUS

## 2018-01-24 MED ORDER — ONDANSETRON HCL 4 MG/2ML IJ SOLN
INTRAMUSCULAR | Status: AC
  Filled 2018-01-24: qty 2

## 2018-01-24 MED ORDER — DEXAMETHASONE SODIUM PHOSPHATE 10 MG/ML IJ SOLN
INTRAMUSCULAR | Status: AC
  Filled 2018-01-24: qty 1

## 2018-01-24 MED ORDER — MIDAZOLAM HCL 2 MG/2ML IJ SOLN
INTRAMUSCULAR | Status: AC
  Filled 2018-01-24: qty 2

## 2018-01-24 MED ORDER — DEXAMETHASONE SODIUM PHOSPHATE 10 MG/ML IJ SOLN
INTRAMUSCULAR | Status: DC | PRN
  Administered 2018-01-24: 10 mg via INTRAVENOUS

## 2018-01-24 MED ORDER — OXYCODONE HCL 5 MG/5ML PO SOLN
5.0000 mg | Freq: Once | ORAL | Status: DC | PRN
  Filled 2018-01-24: qty 5

## 2018-01-24 MED ORDER — BUPIVACAINE HCL (PF) 0.25 % IJ SOLN
INTRAMUSCULAR | Status: AC
  Filled 2018-01-24: qty 30

## 2018-01-24 MED ORDER — LIDOCAINE 2% (20 MG/ML) 5 ML SYRINGE
INTRAMUSCULAR | Status: DC | PRN
  Administered 2018-01-24: 75 mg via INTRAVENOUS

## 2018-01-24 MED ORDER — SODIUM CHLORIDE 0.9% FLUSH: 3.0000 mL | Freq: Two times a day (BID) | INTRAVENOUS | Status: DC

## 2018-01-24 MED ORDER — FENTANYL CITRATE (PF) 100 MCG/2ML IJ SOLN
INTRAMUSCULAR | Status: AC
  Filled 2018-01-24: qty 2

## 2018-01-24 MED ORDER — OXYCODONE HCL 5 MG PO TABS: 5.0000 mg | ORAL_TABLET | Freq: Once | ORAL | Status: DC | PRN

## 2018-01-24 MED ORDER — SODIUM CHLORIDE 0.9 % IV SOLN: 250.0000 mL | INTRAVENOUS | Status: DC | PRN

## 2018-01-24 MED ORDER — BUPIVACAINE-EPINEPHRINE (PF) 0.25% -1:200000 IJ SOLN
INTRAMUSCULAR | Status: DC | PRN
  Administered 2018-01-24: 7 mL via PERINEURAL

## 2018-01-24 MED ORDER — ONDANSETRON HCL 4 MG/2ML IJ SOLN: 4.0000 mg | Freq: Four times a day (QID) | INTRAMUSCULAR | Status: DC | PRN

## 2018-01-24 MED ORDER — LACTATED RINGERS IV SOLN
INTRAVENOUS | Status: DC
  Administered 2018-01-24: 08:00:00 via INTRAVENOUS

## 2018-01-24 MED ORDER — LIDOCAINE 2% (20 MG/ML) 5 ML SYRINGE
INTRAMUSCULAR | Status: AC
  Filled 2018-01-24: qty 5

## 2018-01-24 MED ORDER — BUPIVACAINE-EPINEPHRINE (PF) 0.25% -1:200000 IJ SOLN
INTRAMUSCULAR | Status: AC
  Filled 2018-01-24: qty 30

## 2018-01-24 MED ORDER — MIDAZOLAM HCL 5 MG/5ML IJ SOLN
INTRAMUSCULAR | Status: DC | PRN
  Administered 2018-01-24: 1 mg via INTRAVENOUS

## 2018-01-24 MED ORDER — FENTANYL CITRATE (PF) 100 MCG/2ML IJ SOLN: 25.0000 ug | INTRAMUSCULAR | Status: DC | PRN

## 2018-01-24 MED ORDER — MORPHINE SULFATE (PF) 4 MG/ML IV SOLN: 2.0000 mg | INTRAVENOUS | Status: DC | PRN

## 2018-01-24 MED ORDER — 0.9 % SODIUM CHLORIDE (POUR BTL) OPTIME
TOPICAL | Status: DC | PRN
  Administered 2018-01-24: 1000 mL

## 2018-01-24 MED ORDER — OXYCODONE HCL 5 MG PO TABS: 5.0000 mg | ORAL_TABLET | ORAL | Status: DC | PRN

## 2018-01-24 MED ORDER — ONDANSETRON HCL 4 MG/2ML IJ SOLN
INTRAMUSCULAR | Status: DC | PRN
  Administered 2018-01-24: 4 mg via INTRAVENOUS

## 2018-01-24 MED ORDER — PROPOFOL 10 MG/ML IV BOLUS
INTRAVENOUS | Status: AC
  Filled 2018-01-24: qty 20

## 2018-01-24 MED ORDER — ACETAMINOPHEN 325 MG PO TABS: 650.0000 mg | ORAL_TABLET | ORAL | Status: DC | PRN

## 2018-01-24 MED ORDER — FENTANYL CITRATE (PF) 100 MCG/2ML IJ SOLN
INTRAMUSCULAR | Status: DC | PRN
  Administered 2018-01-24 (×3): 50 ug via INTRAVENOUS

## 2018-01-24 MED ORDER — TRAMADOL HCL 50 MG PO TABS: 50.0000 mg | ORAL_TABLET | Freq: Four times a day (QID) | ORAL | 0 refills | Status: AC | PRN

## 2018-01-24 MED ORDER — SODIUM CHLORIDE 0.9% FLUSH: 3.0000 mL | INTRAVENOUS | Status: DC | PRN

## 2018-01-24 MED ORDER — ACETAMINOPHEN 650 MG RE SUPP
650.0000 mg | RECTAL | Status: DC | PRN
  Filled 2018-01-24: qty 1

## 2018-01-24 SURGICAL SUPPLY — 32 items
BENZOIN TINCTURE PRP APPL 2/3 (GAUZE/BANDAGES/DRESSINGS) IMPLANT
BLADE HEX COATED 2.75 (ELECTRODE) ×2 IMPLANT
BNDG GAUZE ELAST 4 BULKY (GAUZE/BANDAGES/DRESSINGS) IMPLANT
CATH URET 5FR 28IN OPEN ENDED (CATHETERS) IMPLANT
COVER SURGICAL LIGHT HANDLE (MISCELLANEOUS) ×2 IMPLANT
COVER WAND RF STERILE (DRAPES) ×2 IMPLANT
DECANTER SPIKE VIAL GLASS SM (MISCELLANEOUS) IMPLANT
DERMABOND ADVANCED (GAUZE/BANDAGES/DRESSINGS) ×1
DERMABOND ADVANCED .7 DNX12 (GAUZE/BANDAGES/DRESSINGS) ×1 IMPLANT
DRAIN PENROSE 18X1/2 LTX STRL (DRAIN) ×2 IMPLANT
DRAIN PENROSE 18X1/4 LTX STRL (WOUND CARE) IMPLANT
DRAPE LAPAROTOMY TRNSV 102X78 (DRAPE) ×2 IMPLANT
ELECT REM PT RETURN 15FT ADLT (MISCELLANEOUS) ×2 IMPLANT
GAUZE SPONGE 4X4 12PLY STRL (GAUZE/BANDAGES/DRESSINGS) IMPLANT
GLOVE SURG SS PI 8.0 STRL IVOR (GLOVE) ×2 IMPLANT
GOWN STRL REUS W/TWL XL LVL3 (GOWN DISPOSABLE) ×2 IMPLANT
KIT BASIN OR (CUSTOM PROCEDURE TRAY) ×2 IMPLANT
NEEDLE HYPO 22GX1.5 SAFETY (NEEDLE) ×2 IMPLANT
NS IRRIG 1000ML POUR BTL (IV SOLUTION) IMPLANT
PACK GENERAL/GYN (CUSTOM PROCEDURE TRAY) ×2 IMPLANT
SOL PREP POV-IOD 16OZ 10% (MISCELLANEOUS) ×2 IMPLANT
SPONGE LAP 4X18 RFD (DISPOSABLE) IMPLANT
SUPPORT SCROTAL LG STRP (MISCELLANEOUS) IMPLANT
SUT CHROMIC 3 0 SH 27 (SUTURE) ×2 IMPLANT
SUT CHROMIC 4 0 SH 27 (SUTURE) IMPLANT
SUT MNCRL AB 4-0 PS2 18 (SUTURE) ×2 IMPLANT
SUT VIC AB 3-0 SH 27 (SUTURE) ×1
SUT VIC AB 3-0 SH 27XBRD (SUTURE) ×1 IMPLANT
SUT VICRYL 0 TIES 12 18 (SUTURE) ×2 IMPLANT
SYR CONTROL 10ML LL (SYRINGE) ×2 IMPLANT
TOWEL OR 17X26 10 PK STRL BLUE (TOWEL DISPOSABLE) ×2 IMPLANT
WATER STERILE IRR 1000ML POUR (IV SOLUTION) IMPLANT

## 2018-01-24 NOTE — Transfer of Care (Signed)
Immediate Anesthesia Transfer of Care Note  Patient: MELQUIADES KOVAR  Procedure(s) Performed: [name] OF LEFT INGUINAL CYST (Left Inguinal)  Patient Location: PACU  Anesthesia Type:General  Level of Consciousness: sedated  Airway & Oxygen Therapy: Patient Spontanous Breathing and Patient connected to face mask oxygen  Post-op Assessment: Report given to RN and Post -op Vital signs reviewed and stable  Post vital signs: Reviewed and stable  Last Vitals:  Vitals Value Taken Time  BP    Temp    Pulse 58 01/24/2018 11:44 AM  Resp    SpO2 100 % 01/24/2018 11:44 AM  Vitals shown include unvalidated device data.  Last Pain:  Vitals:   01/24/18 0719  TempSrc: Oral  PainSc: 0-No pain         Complications: No apparent anesthesia complications

## 2018-01-24 NOTE — Anesthesia Preprocedure Evaluation (Signed)
Anesthesia Evaluation  Patient identified by MRN, date of birth, ID band Patient awake    Reviewed: Allergy & Precautions, H&P , NPO status , Patient's Chart, lab work & pertinent test results  Airway Mallampati: II   Neck ROM: full    Dental   Pulmonary neg pulmonary ROS,    breath sounds clear to auscultation       Cardiovascular negative cardio ROS   Rhythm:regular Rate:Normal     Neuro/Psych    GI/Hepatic GERD  ,  Endo/Other    Renal/GU      Musculoskeletal  (+) Arthritis ,   Abdominal   Peds  Hematology   Anesthesia Other Findings   Reproductive/Obstetrics                             Anesthesia Physical Anesthesia Plan  ASA: II  Anesthesia Plan: General   Post-op Pain Management:    Induction: Intravenous  PONV Risk Score and Plan: 2 and Ondansetron, Dexamethasone, Midazolam and Treatment may vary due to age or medical condition  Airway Management Planned: LMA  Additional Equipment:   Intra-op Plan:   Post-operative Plan:   Informed Consent: I have reviewed the patients History and Physical, chart, labs and discussed the procedure including the risks, benefits and alternatives for the proposed anesthesia with the patient or authorized representative who has indicated his/her understanding and acceptance.     Plan Discussed with: CRNA, Anesthesiologist and Surgeon  Anesthesia Plan Comments:         Anesthesia Quick Evaluation

## 2018-01-24 NOTE — Anesthesia Procedure Notes (Signed)
Procedure Name: LMA Insertion Date/Time: 01/24/2018 10:50 AM Performed by: Lollie Sails, CRNA Pre-anesthesia Checklist: Patient identified, Emergency Drugs available, Suction available, Patient being monitored and Timeout performed Patient Re-evaluated:Patient Re-evaluated prior to induction Oxygen Delivery Method: Circle system utilized Preoxygenation: Pre-oxygenation with 100% oxygen Induction Type: IV induction Ventilation: Mask ventilation without difficulty LMA: LMA inserted LMA Size: 5.0 Number of attempts: 1 Placement Confirmation: positive ETCO2 and breath sounds checked- equal and bilateral Tube secured with: Tape Dental Injury: Teeth and Oropharynx as per pre-operative assessment

## 2018-01-24 NOTE — Discharge Instructions (Signed)
HOME CARE INSTRUCTIONS FOR INGUINAL PROCEDURES  Wound Care & Hygiene: You may apply an ice bag to the wound for the first 24 hours.  This may help decrease swelling and soreness.   You may remove the dressing in 24 hours and shower in 48 hours.  Continue to use the athletic supporter or tight briefs for at least a week.  Activity: Rest today - not necessarily flat bed rest.  Just take it easy.  You should not do strenuous activities until your follow-up visit with your doctor.  You may resume light activity in 48 hours.  Return to Work:  Your doctor will advise you of this depending on the type of work you do  Diet: Drink liquids or eat a light diet this evening.  You may resume a regular diet tomorrow.  General Expectations: You may have a small amount of bleeding.  The scrotum may be swollen or bruised for about a week.  Call your Doctor if these occur:  -persistent or heavy bleeding  -temperature of 101 degrees or more  -severe pain, not relieved by your pain medication    Patient Signature:  __________________________________________________  Nurse's Signature:  __________________________________________________

## 2018-01-24 NOTE — Interval H&P Note (Signed)
History and Physical Interval Note:  01/24/2018 9:21 AM  Brandon Dyer  has presented today for surgery, with the diagnosis of LEFT INGUINAL CYST/ SPERMATOCELE  The various methods of treatment have been discussed with the patient and family. After consideration of risks, benefits and other options for treatment, the patient has consented to  Procedure(s): EXCISON OF LEFT INGUINAL CYST (Left) as a surgical intervention .  The patient's history has been reviewed, patient examined, no change in status, stable for surgery.  I have reviewed the patient's chart and labs.  Questions were answered to the patient's satisfaction.     Irine Seal

## 2018-01-24 NOTE — Op Note (Signed)
Procedure: Excision of a left inguinal cyst.  Preop diagnosis: 1.5 cm left inguinal cyst/spermatocele.  Postop diagnosis: Same.  Surgeon: Dr. Irine Seal.  Anesthesia: General.  Specimen: Cyst wall.  EBL: Minimal.  Drains: None.  Complications: None.  Indications: Mr. Rico Junker is a 72-year-old male who has a 1.5 cm cystic lesion just outside the external ring on the left he is elected surgical excision.  Procedure: He was given 2 g of Ancef.  A general anesthetic was induced with the patient in the supine position.  His left inguinal area and scrotum was clipped, he was prepped with Betadine solution and he was draped in the usual sterile fashion.  A 3 cm left subinguinal incision was made with a knife along the skin lines over the palpable cystic lesion.  The subcutaneous tissue was incised with the Bovie until the cystic lesion could be readily identified within the incision.  The lesion was noted to be within the structures of the spermatic cord which were grasped and teased away from the cystic lesion.  Once the cyst had been freed from the surrounding structures there appeared to be a small neck approximately.  The neck was clamped with a hemostat and the cyst was excised.  The neck was then oversewn with a 3-0 chromic suture ligature.  Once hemostasis was assured, the subcutaneous tissue was reapproximated using an interrupted 3-0 chromic suture.  The skin edges were infiltrated with 5 ml of quarter percent Marcaine with epi.  The wound was then closed with a running  Intracuticular 4-0 Monocryl.  The wound was reinforced with Dermabond.  Patient's anesthetic was reversed and he was moved recovery room in stable condition.  There were no complications.

## 2018-01-25 NOTE — Anesthesia Postprocedure Evaluation (Signed)
Anesthesia Post Note  Patient: Brandon Dyer  Procedure(s) Performed: [name] OF LEFT INGUINAL CYST (Left Inguinal)     Patient location during evaluation: PACU Anesthesia Type: General Level of consciousness: awake and alert Pain management: pain level controlled Vital Signs Assessment: post-procedure vital signs reviewed and stable Respiratory status: spontaneous breathing, nonlabored ventilation, respiratory function stable and patient connected to nasal cannula oxygen Cardiovascular status: blood pressure returned to baseline and stable Postop Assessment: no apparent nausea or vomiting Anesthetic complications: no    Last Vitals:  Vitals:   01/24/18 1230 01/24/18 1300  BP: 136/86 (!) 147/95  Pulse: 60 (!) 58  Resp: 17 16  Temp: 36.7 C 36.7 C  SpO2: 93% 99%    Last Pain:  Vitals:   01/24/18 1300  TempSrc:   PainSc: 0-No pain                 Barnet Glasgow

## 2018-01-26 ENCOUNTER — Encounter (HOSPITAL_COMMUNITY): Payer: Self-pay | Admitting: Urology

## 2019-09-19 ENCOUNTER — Ambulatory Visit (INDEPENDENT_AMBULATORY_CARE_PROVIDER_SITE_OTHER): Payer: Medicare Other | Admitting: Orthopaedic Surgery

## 2019-09-19 ENCOUNTER — Ambulatory Visit: Payer: Self-pay

## 2019-09-19 ENCOUNTER — Encounter: Payer: Self-pay | Admitting: Orthopaedic Surgery

## 2019-09-19 DIAGNOSIS — M25572 Pain in left ankle and joints of left foot: Secondary | ICD-10-CM

## 2019-09-19 DIAGNOSIS — M76822 Posterior tibial tendinitis, left leg: Secondary | ICD-10-CM | POA: Diagnosis not present

## 2019-09-19 MED ORDER — METHYLPREDNISOLONE 4 MG PO TABS: ORAL_TABLET | ORAL | 0 refills | Status: AC

## 2019-09-19 MED ORDER — DICLOFENAC SODIUM 75 MG PO TBEC: 75.0000 mg | DELAYED_RELEASE_TABLET | Freq: Two times a day (BID) | ORAL | 1 refills | Status: AC | PRN

## 2019-09-19 NOTE — Progress Notes (Signed)
Office Visit Note   Patient: Brandon Dyer           Date of Birth: 04/25/1945           MRN: [id number] Visit Date: 09/19/2019              Requested by: Lavone Orn, MD [address] [place],  [place] [address] PCP: Lavone Orn, MD   Assessment & Plan: Visit Diagnoses:  1. Pain in left ankle and joints of left foot   2. Posterior tibial tendinitis, left leg     Plan: I did explain to the patient that I feel that this is tendinitis of the posterior tibial tendon of the left ankle.  I want her to try an ASO.  I also recommended Voltaren gel.  I would put him on diclofenac and a 6-day steroid taper.  I did offer physical therapy but he wants to try this first.  We will see him back in 4 weeks to see how he is doing overall.  All question concerns were answered and addressed.  My neck step would be formal physical therapy if this does not help.  Follow-Up Instructions: Return in about 4 weeks (around 10/17/2019).   Orders:  Orders Placed This Encounter  Procedures   XR Ankle Complete Left   Meds ordered this encounter  Medications   methylPREDNISolone (MEDROL) 4 MG tablet    Sig: Medrol dose pack. Take as instructed    Dispense:  21 tablet    Refill:  0   diclofenac (VOLTAREN) 75 MG EC tablet    Sig: Take 1 tablet (75 mg total) by mouth 2 (two) times daily between meals as needed.    Dispense:  60 tablet    Refill:  1      Procedures: No procedures performed   Clinical Data: No additional findings.   Subjective: Chief Complaint  Patient presents with   Left Ankle - Pain  The patient is a very pleasant and active 74 year old gentleman who comes in for evaluation treatment of left ankle pain.  This is been going on for years and has tried conservative treatment and even wears a compression sock on occasion and an elastic ankle sleeve.  His primary care physician did try a boot but he did not wear the boot per his report.  He was very athletic  going up playing basketball and football.  He had probably twisted his ankle on occasion but no significant injuries that he is aware of and no fractures.  He is never had surgery on the ankle.  He is not a diabetic.  He is a tall individual and denies any flatfoot deformities.  He says the pain radiates in the medial aspect of the ankles where he points to and goes to the big toe on that side.  He denies any numbness and tingling in his feet  HPI  Review of Systems   There is currently no listed headache, chest pain, shortness of breath, fever, chills, nausea, vomiting  Objective: Vital Signs: There were no vitals taken for this visit.  Physical Exam He is alert and orient x3 and in no acute distress Ortho Exam Examination of his left foot shows a normal arch.  He is neurovascularly intact.  He can perform a toe raise easily and does have pain to palpation along the course of the posterior tibial tendon.  His Achilles is intact.  He does have a bunion deformity of his great  toe but no significant pain throughout the arc of motion of the big toe except for just mild at the extremes of flexion extension of the great toe. Specialty Comments:  No specialty comments available.  Imaging: XR Ankle Complete Left  Result Date: 09/19/2019 3 views of the left ankle shows some mild arthritic changes of the medial malleolus but otherwise no acute findings.    PMFS History: Patient Active Problem List   Diagnosis Date Noted   Primary osteoarthritis of left hip 11/18/2017   Epidermal cyst 10/30/2017   Inguinal hernia unilateral, non-recurrent, left 10/18/2011   Past Medical History:  Diagnosis Date   Cancer ([place]) 12-01-11   prostate- dx. 2008-Radiation tx.   Epidermal cyst    on back   GERD (gastroesophageal reflux disease)    occ. -uses OTC med-PRILOSEC   Glaucoma     History reviewed. No pertinent family history.  Past Surgical History:  Procedure Laterality Date   APPENDECTOMY      CATARACT EXTRACTION     cataract surgery     one eye   CYSTECTOMY     on back   HERNIA REPAIR  12/06/11   LIH repair with mesh   INGUINAL HERNIA REPAIR  12/06/2011   Procedure: HERNIA REPAIR INGUINAL ADULT;  Surgeon: Earnstine Regal, MD;  [facility];  Service: General;  Laterality: Left;  Repair Left Inguinal Hernia with Mesh   INSERTION OF MESH  12/06/2011   Procedure: INSERTION OF MESH;  Surgeon: Earnstine Regal, MD;  [facility]: [facility];  Service: General;  Laterality: Left;   MASS EXCISION N/A 11/01/2017   Procedure: EXCISION OF EPIDERMAL CYSTS 2 FROM MID BACK;  Surgeon: Armandina Gemma, MD;  Location: [facility];  Service: General;  Laterality: N/A;   SPERMATOCELECTOMY Left 01/24/2018   Procedure: EXCISON OF LEFT INGUINAL CYST;  Surgeon: Irine Seal, MD;  [facility];  Service: Urology;  Laterality: Left;   TOTAL HIP ARTHROPLASTY Left 11/18/2017   Procedure: TOTAL HIP ARTHROPLASTY ANTERIOR APPROACH;  Surgeon: Dorna Leitz, MD;  [facility];  Service: Orthopedics;  Laterality: Left;   Social History   Occupational History   Not on file  Tobacco Use   Smoking status: Never Smoker   Smokeless tobacco: Never Used  Vaping Use   Vaping Use: Never used  Substance and Sexual Activity   Alcohol use: Yes    Comment: occ. social   Drug use: No   Sexual activity: Yes

## 2019-10-17 ENCOUNTER — Encounter: Payer: Self-pay | Admitting: Orthopaedic Surgery

## 2019-10-17 ENCOUNTER — Ambulatory Visit (INDEPENDENT_AMBULATORY_CARE_PROVIDER_SITE_OTHER): Payer: Medicare Other | Admitting: Orthopaedic Surgery

## 2019-10-17 DIAGNOSIS — M25572 Pain in left ankle and joints of left foot: Secondary | ICD-10-CM | POA: Diagnosis not present

## 2019-10-17 DIAGNOSIS — M76822 Posterior tibial tendinitis, left leg: Secondary | ICD-10-CM | POA: Diagnosis not present

## 2019-10-17 NOTE — Progress Notes (Signed)
The patient is following up 1 month after I saw him for posterior tibial tendinitis.  He is 74 years old very active and walks a lot daily.  We treated him and an ASO with oral and topical anti-inflammatories.  He says he is doing better overall.  He says is not as bad as it was.  He feels like the ankle brace is helpful.  Today he is also wearing work boots that offer good support.  Examination of his left ankle shows no swelling along the course the posterior tibial tendon.  It is slightly painful but he can perform a toe raise easily and has excellent range of motion of the ankle on the left side.  This point he can follow-up as needed since he is doing so well and this should hopefully run its course.  If it does not improve, my neck step would be outpatient formal physical therapy.  All question concerns were answered and addressed.  Follow-up can be as needed.

## 2019-11-07 ENCOUNTER — Ambulatory Visit
Admission: RE | Admit: 2019-11-07 | Discharge: 2019-11-07 | Disposition: A | Discharge status: Home / Self Care | Payer: Medicare Other | Source: Ambulatory Visit | Attending: Internal Medicine | Admitting: Internal Medicine

## 2019-11-07 ENCOUNTER — Other Ambulatory Visit: Payer: Self-pay | Admitting: Internal Medicine

## 2019-11-07 DIAGNOSIS — R2241 Localized swelling, mass and lump, right lower limb: Secondary | ICD-10-CM

## 2019-11-28 ENCOUNTER — Ambulatory Visit: Payer: Medicare Other | Admitting: Orthopaedic Surgery

## 2019-11-28 DIAGNOSIS — M25561 Pain in right knee: Secondary | ICD-10-CM | POA: Diagnosis not present

## 2019-11-28 MED ORDER — METHYLPREDNISOLONE ACETATE 40 MG/ML IJ SUSP
40.0000 mg | INTRAMUSCULAR | Status: AC | PRN
  Administered 2019-11-28: 40 mg via INTRA_ARTICULAR

## 2019-11-28 MED ORDER — LIDOCAINE HCL 1 % IJ SOLN
3.0000 mL | INTRAMUSCULAR | Status: AC | PRN
  Administered 2019-11-28: 3 mL

## 2019-11-28 NOTE — Progress Notes (Signed)
Office Visit Note   Patient: Brandon Dyer           Date of Birth: 1945/09/27           MRN: [id number] Visit Date: 11/28/2019              Requested by: Lavone Orn, MD [address] [place],  [place] [id number] PCP: Lavone Orn, MD   Assessment & Plan: Visit Diagnoses:  1. Acute pain of right knee     Plan: I was able to place a steroid injection in his right knee without difficulty.  The small palpable soft tissue mass just to the medial aspect of the patella tendon was something I could not aspirate any fluid from or gelatinous material.  I tried to aspirate it on 2 occasions.  He will continue to increase his activities as comfort allows.  I would like to see him back in 4 weeks to reassess his knee.  If that mass in the soft tissue is still there we would likely consider a MRI of his right knee to better evaluate this.  This is likely a benign process regardless.  All question concerns were answered and addressed.  Follow-Up Instructions: Return in about 4 weeks (around 12/26/2019).   Orders:  Orders Placed This Encounter  Procedures  . Large Joint Inj   No orders of the defined types were placed in this encounter.     Procedures: Large Joint Inj: R knee on 11/28/2019 10:37 AM Indications: diagnostic evaluation and pain Details: 22 G 1.5 in needle, superolateral approach  Arthrogram: No  Medications: 3 mL lidocaine 1 %; 40 mg methylPREDNISolone acetate 40 MG/ML Outcome: tolerated well, no immediate complications Procedure, treatment alternatives, risks and benefits explained, specific risks discussed. Consent was given by the patient. Immediately prior to procedure a time out was called to verify the correct patient, procedure, equipment, support staff and site/side marked as required. Patient was prepped and draped in the usual sterile fashion.       Clinical Data: No additional findings.   Subjective: Chief Complaint  Patient presents with    . Right Knee - Pain  The patient comes in for evaluation treatment of right knee pain and swelling.  He has no known injury.  He is 74 years old.  He says his knee hurts to get down right now in terms of doing activities day living on housecleaning activities.  There are x-rays on the canopy system of this knee for me to review.  He does report some swelling.  He says not a constant pain that is more off and on with activities and if he puts pressure down his knee when he is cleaning his tub.  He is never had surgery on that knee.  He is now diabetic.  He does not remember any specific injury.  HPI  Review of Systems There is currently listed no headache, chest pain, short of breath, fever, chills, nausea, vomiting  Objective: Vital Signs: There were no vitals taken for this visit.  Physical Exam He is alert and orient x3 and in no acute distress Ortho Exam  examination of his right knee shows no effusion.  There is slight varus malalignment.  His range of motion is full and his knee is limited stable.  There is a small soft tissue mass just medial to the patella tendon in the anterior aspect of his knee.  This is nonpainful to him.  There is no redness.  Specialty Comments:  No specialty comments available.  Imaging: No results found. X-rays of the right knee reviewed on the canopy system do show moderate degenerative changes with varus malalignment and medial joint space narrowing.  There are periarticular osteophytes in all 3 compartments.  PMFS History: Patient Active Problem List   Diagnosis Date Noted  . Primary osteoarthritis of left hip 11/18/2017  . Epidermal cyst 10/30/2017  . Inguinal hernia unilateral, non-recurrent, left 10/18/2011   Past Medical History:  Diagnosis Date  . Cancer ([place]) 12-01-11   prostate- dx. 2008-Radiation tx.  . Epidermal cyst    on back  . GERD (gastroesophageal reflux disease)    occ. -uses OTC med-PRILOSEC  . Glaucoma     No family history  on file.  Past Surgical History:  Procedure Laterality Date  . APPENDECTOMY    . CATARACT EXTRACTION    . cataract surgery     one eye  . CYSTECTOMY     on back  . HERNIA REPAIR  12/06/11   LIH repair with mesh  . INGUINAL HERNIA REPAIR  12/06/2011   Procedure: HERNIA REPAIR INGUINAL ADULT;  Surgeon: Earnstine Regal, MD;  [facility];  Service: General;  Laterality: Left;  Repair Left Inguinal Hernia with Mesh  . INSERTION OF MESH  12/06/2011   Procedure: INSERTION OF MESH;  Surgeon: Earnstine Regal, MD;  [facility];  Service: General;  Laterality: Left;  Marland Kitchen MASS EXCISION N/A 11/01/2017   Procedure: EXCISION OF EPIDERMAL CYSTS 2 FROM MID BACK;  Surgeon: Armandina Gemma, MD;  Location: [facility];  Service: General;  Laterality: N/A;  . SPERMATOCELECTOMY Left 01/24/2018   Procedure: [name] OF LEFT INGUINAL CYST;  Surgeon: Irine Seal, MD;  [facility];  Service: Urology;  Laterality: Left;  . TOTAL HIP ARTHROPLASTY Left 11/18/2017   Procedure: TOTAL HIP ARTHROPLASTY ANTERIOR APPROACH;  Surgeon: Dorna Leitz, MD;  [facility];  Service: Orthopedics;  Laterality: Left;   Social History   Occupational History  . Not on file  Tobacco Use  . Smoking status: Never Smoker  . Smokeless tobacco: Never Used  Vaping Use  . Vaping Use: Never used  Substance and Sexual Activity  . Alcohol use: Yes    Comment: occ. social  . Drug use: No  . Sexual activity: Yes

## 2019-12-17 ENCOUNTER — Encounter: Payer: Self-pay | Admitting: Orthopaedic Surgery

## 2019-12-17 ENCOUNTER — Ambulatory Visit: Payer: Medicare Other | Admitting: Orthopaedic Surgery

## 2019-12-17 DIAGNOSIS — M722 Plantar fascial fibromatosis: Secondary | ICD-10-CM

## 2019-12-17 DIAGNOSIS — M76822 Posterior tibial tendinitis, left leg: Secondary | ICD-10-CM

## 2019-12-17 DIAGNOSIS — M25572 Pain in left ankle and joints of left foot: Secondary | ICD-10-CM

## 2019-12-17 MED ORDER — LIDOCAINE HCL 1 % IJ SOLN
0.5000 mL | INTRAMUSCULAR | Status: AC | PRN
  Administered 2019-12-17: .5 mL

## 2019-12-17 MED ORDER — METHYLPREDNISOLONE ACETATE 40 MG/ML IJ SUSP
40.0000 mg | INTRAMUSCULAR | Status: AC | PRN
  Administered 2019-12-17: 40 mg

## 2019-12-17 NOTE — Progress Notes (Signed)
Office Visit Note   Patient: Brandon Dyer           Date of Birth: 01-Dec-1945           MRN: [id number] Visit Date: 12/17/2019              Requested by: Lavone Orn, MD [address] [place],  [place] [address] PCP: Lavone Orn, MD   Assessment & Plan: Visit Diagnoses:  1. Pain in left ankle and joints of left foot   2. Posterior tibial tendinitis, left leg   3. Plantar fasciitis of left foot     Plan: I did provide a steroid injection in his left heel without difficulty on the bottom plantar aspect.  I gave him prescription for outpatient physical therapy for both his left ankle and his left foot.  I can always see him back in 6 weeks to see how is doing overall.  All questions and concerns were answered and addressed.  Follow-Up Instructions: Return in about 6 weeks (around 01/28/2020).   Orders:  Orders Placed This Encounter  Procedures  . Foot Inj   No orders of the defined types were placed in this encounter.     Procedures: Foot Inj  Date/Time: 12/17/2019 3:50 PM Performed by: Mcarthur Rossetti, MD Authorized by: Mcarthur Rossetti, MD   Indications:  Fasciitis Condition: Plantar Fasciitis   Location: left plantar fascia muscle   Medications:  0.5 mL lidocaine 1 %; 40 mg methylPREDNISolone acetate 40 MG/ML     Clinical Data: No additional findings.   Subjective: Chief Complaint  Patient presents with  . Left Ankle - Pain  The patient is a 74 year old gentleman I seen before.  This is been for posterior tibial tendinitis of his left ankle.  I also replaced his right hip.  He has been having some left heel pain now.  We had tried a cam walker but that has not helped him.  He is very active.  The pain is been off and on but is definitely having problems when he first takes a step in the morning.  He also has still posterior ankle problems at the posterior lateral aspect of the medial malleolus in terms of pain.  He does not have  a flatfoot deformity.  He has had no other acute changes in medical status.  HPI  Review of Systems He currently denies any headache, chest pain, shortness of breath, fever, chills, nausea, vomiting  Objective: Vital Signs: There were no vitals taken for this visit.  Physical Exam He is alert and orient x3 and in no acute distress Ortho Exam Examination his left ankle shows no flatfoot deformity.  His Achilles is intact.  He does have pain to palpation over the medial aspect of the ankle at the posterior tibial tendon course.  He can go up on his toes but he is painful and weak to do so.  There is also pain over the plantar fascia. Specialty Comments:  No specialty comments available.  Imaging: No results found.   PMFS History: Patient Active Problem List   Diagnosis Date Noted  . Primary osteoarthritis of left hip 11/18/2017  . Epidermal cyst 10/30/2017  . Inguinal hernia unilateral, non-recurrent, left 10/18/2011   Past Medical History:  Diagnosis Date  . Cancer ([facility]) 12-01-11   prostate- dx. 2008-Radiation tx.  . Epidermal cyst    on back  . GERD (gastroesophageal reflux disease)    occ. -uses OTC med-PRILOSEC  .  Glaucoma     History reviewed. No pertinent family history.  Past Surgical History:  Procedure Laterality Date  . APPENDECTOMY    . CATARACT EXTRACTION    . cataract surgery     one eye  . CYSTECTOMY     on back  . HERNIA REPAIR  12/06/11   LIH repair with mesh  . INGUINAL HERNIA REPAIR  12/06/2011   Procedure: HERNIA REPAIR INGUINAL ADULT;  Surgeon: Earnstine Regal, MD;  [facility];  Service: General;  Laterality: Left;  Repair Left Inguinal Hernia with Mesh  . INSERTION OF MESH  12/06/2011   Procedure: INSERTION OF MESH;  Surgeon: Earnstine Regal, MD;  [facility];  Service: General;  Laterality: Left;  Marland Kitchen MASS EXCISION N/A 11/01/2017   Procedure: EXCISION OF EPIDERMAL CYSTS 2 FROM MID BACK;  Surgeon: Armandina Gemma, MD;  Location: [facility];  Service: General;  Laterality: N/A;  . SPERMATOCELECTOMY Left 01/24/2018   Procedure: [name] OF LEFT INGUINAL CYST;  Surgeon: Irine Seal, MD;  [facility];  Service: Urology;  Laterality: Left;  . TOTAL HIP ARTHROPLASTY Left 11/18/2017   Procedure: TOTAL HIP ARTHROPLASTY ANTERIOR APPROACH;  Surgeon: Dorna Leitz, MD;  [facility];  Service: Orthopedics;  Laterality: Left;   Social History   Occupational History  . Not on file  Tobacco Use  . Smoking status: Never Smoker  . Smokeless tobacco: Never Used  Vaping Use  . Vaping Use: Never used  Substance and Sexual Activity  . Alcohol use: Yes    Comment: occ. social  . Drug use: No  . Sexual activity: Yes

## 2019-12-26 ENCOUNTER — Ambulatory Visit: Payer: Medicare Other | Admitting: Orthopaedic Surgery

## 2019-12-26 ENCOUNTER — Encounter: Payer: Self-pay | Admitting: Orthopaedic Surgery

## 2019-12-26 DIAGNOSIS — M25572 Pain in left ankle and joints of left foot: Secondary | ICD-10-CM

## 2019-12-26 DIAGNOSIS — M76822 Posterior tibial tendinitis, left leg: Secondary | ICD-10-CM

## 2019-12-26 DIAGNOSIS — M25561 Pain in right knee: Secondary | ICD-10-CM

## 2019-12-26 NOTE — Progress Notes (Signed)
The patient is following up close to a month after having a steroid injection in his right knee.  He says the right knee pain is gone and the knee injection helped quite a bit.  I have also called him for left foot and ankle pain and posterior tibial tendinitis.  He now has an insert in his shoe and he said he will try that for well.  He reports overall that he is doing great.  He has had no acute changes in medical status.  Examination of his right knee shows just mild varus malalignment.  There is no effusion with good range of motion and no significant pain.  He is able to go up on his toes easier on the left side and has some discomfort along the course the posterior tibial tendon but not severe.  At this point follow-up can be as needed since he is doing well.  If he has any worsening of his symptoms at all he knows to come and see [place].  All questions and concerns were answered and addressed.

## 2020-01-28 ENCOUNTER — Ambulatory Visit: Payer: Medicare Other | Admitting: Orthopaedic Surgery

## 2020-01-29 ENCOUNTER — Encounter: Payer: Self-pay | Admitting: Orthopaedic Surgery

## 2020-01-29 ENCOUNTER — Ambulatory Visit: Payer: Medicare Other | Admitting: Orthopaedic Surgery

## 2020-01-29 DIAGNOSIS — M722 Plantar fascial fibromatosis: Secondary | ICD-10-CM

## 2020-01-29 DIAGNOSIS — B070 Plantar wart: Secondary | ICD-10-CM | POA: Diagnosis not present

## 2020-01-29 DIAGNOSIS — K219 Gastro-esophageal reflux disease without esophagitis: Secondary | ICD-10-CM | POA: Insufficient documentation

## 2020-01-29 DIAGNOSIS — H9190 Unspecified hearing loss, unspecified ear: Secondary | ICD-10-CM | POA: Insufficient documentation

## 2020-01-29 DIAGNOSIS — Z8546 Personal history of malignant neoplasm of prostate: Secondary | ICD-10-CM | POA: Insufficient documentation

## 2020-01-29 DIAGNOSIS — J302 Other seasonal allergic rhinitis: Secondary | ICD-10-CM | POA: Insufficient documentation

## 2020-01-29 DIAGNOSIS — M19072 Primary osteoarthritis, left ankle and foot: Secondary | ICD-10-CM | POA: Insufficient documentation

## 2020-01-29 DIAGNOSIS — G8929 Other chronic pain: Secondary | ICD-10-CM | POA: Insufficient documentation

## 2020-01-29 DIAGNOSIS — M1610 Unilateral primary osteoarthritis, unspecified hip: Secondary | ICD-10-CM | POA: Insufficient documentation

## 2020-01-29 DIAGNOSIS — N529 Male erectile dysfunction, unspecified: Secondary | ICD-10-CM | POA: Insufficient documentation

## 2020-01-29 MED ORDER — BUPIVACAINE HCL 0.5 % IJ SOLN
1.0000 mL | INTRAMUSCULAR | Status: AC | PRN
  Administered 2020-01-29: 1 mL

## 2020-01-29 MED ORDER — METHYLPREDNISOLONE ACETATE 40 MG/ML IJ SUSP
40.0000 mg | INTRAMUSCULAR | Status: AC | PRN
  Administered 2020-01-29: 40 mg

## 2020-01-29 NOTE — Progress Notes (Signed)
Office Visit Note   Patient: Brandon Dyer           Date of Birth: 1945/09/27           MRN: [id number] Visit Date: 01/29/2020              Requested by: Lavone Orn, MD [address] [place],  [place] [address] PCP: Lavone Orn, MD    Assessment & Plan: Visit Diagnoses:  1. Plantar fasciitis of left foot   2. Plantar wart of left foot     Plan: At the patient's request of the injection for plantar fasciitis was performed patient tolerated well.  Regards to the plantars wart a 10 blade scalpel was used to pare this area down.  Patient tolerated well.  Recommended he use an over-the-counter salicylic acid.  He will follow up with [place] as needed.  Discussed with him shoe wear and also stretching activities.  Questions were encouraged and answered.  Felt that his supination of the left foot may be employed his gait due to the plantar fasciitis and the plantars wart we will see how he does with the treatments provided today.   Follow-Up Instructions: Return if symptoms worsen or fail to improve.   Orders:  Orders Placed This Encounter  Procedures  . Foot Inj   No orders of the defined types were placed in this encounter.     Procedures: Foot Inj  Date/Time: 01/29/2020 10:51 AM Performed by: Pete Pelt, PA-C Authorized by: Pete Pelt, PA-C   Indications:  Pain Condition: Plantar Fasciitis   Location: left plantar fascia muscle   Needle Size:  27 G Approach:  Plantar Medications:  40 mg methylPREDNISolone acetate 40 MG/ML; 1 mL bupivacaine 0.5 %     Clinical Data: No additional findings.   Subjective: Chief Complaint  Patient presents with  . Right Knee - Follow-up    HPI Brandon Dyer returns today due to left foot pain.  He states that the plantar fascial injection was helpful in the past and is requesting another injection for plantar fasciitis.  He also notes that he is walking on the side of his left foot is unsure of how long this  has been ongoing.  He has had no new injury to either foot.  He denies wearing the open back shoes and states that he change his shoes out daily.  Review of Systems Please see HPI  Objective: Vital Signs: There were no vitals taken for this visit.  Physical Exam Constitutional:      Appearance: He is not ill-appearing or diaphoretic.  Pulmonary:     Effort: Pulmonary effort is normal.  Neurological:     Mental Status: He is alert and oriented to person, place, and time.  Psychiatric:        Mood and Affect: Mood normal.     Ortho Exam Left foot and is maximal tenderness over the medial aspect of the plantar fascia near the medial tubercle of the calcaneus.  Minimal tenderness over the posterior tibial tendon right foot only.  Call toes left foot only.  Stands with the left foot supinated.  Out of 5 strength with inversion eversion against resistance.  Second metatarsal shaft region varies plantar wart present.  Otherwise no rashes skin lesions ulcerations impending ulcers bilateral feet.  Remainder the left foot is nontender including the Achilles tendon. Specialty Comments:  No specialty comments available.  Imaging: No results found.   PMFS History: Patient  Active Problem List   Diagnosis Date Noted  . Primary osteoarthritis of left hip 11/18/2017  . Epidermal cyst 10/30/2017  . Inguinal hernia unilateral, non-recurrent, left 10/18/2011   Past Medical History:  Diagnosis Date  . Cancer ([facility]) 12-01-11   prostate- dx. 2008-Radiation tx.  . Epidermal cyst    on back  . GERD (gastroesophageal reflux disease)    occ. -uses OTC med-PRILOSEC  . Glaucoma     No family history on file.  Past Surgical History:  Procedure Laterality Date  . APPENDECTOMY    . CATARACT EXTRACTION    . cataract surgery     one eye  . CYSTECTOMY     on back  . HERNIA REPAIR  12/06/11   LIH repair with mesh  . INGUINAL HERNIA REPAIR  12/06/2011   Procedure: HERNIA REPAIR INGUINAL ADULT;   Surgeon: Velora Heckler, MD;  [facility];  Service: General;  Laterality: Left;  Repair Left Inguinal Hernia with Mesh  . INSERTION OF MESH  12/06/2011   Procedure: INSERTION OF MESH;  Surgeon: Velora Heckler, MD;  [facility];  Service: General;  Laterality: Left;  Marland Kitchen MASS EXCISION N/A 11/01/2017   Procedure: EXCISION OF EPIDERMAL CYSTS 2 FROM MID BACK;  Surgeon: Darnell Level, MD;  Location: [facility];  Service: General;  Laterality: N/A;  . SPERMATOCELECTOMY Left 01/24/2018   Procedure: Reuel Derby OF LEFT INGUINAL CYST;  Surgeon: Bjorn Pippin, MD;  [facility];  Service: Urology;  Laterality: Left;  . TOTAL HIP ARTHROPLASTY Left 11/18/2017   Procedure: TOTAL HIP ARTHROPLASTY ANTERIOR APPROACH;  Surgeon: Jodi Geralds, MD;  [facility];  Service: Orthopedics;  Laterality: Left;   Social History   Occupational History  . Not on file  Tobacco Use  . Smoking status: Never Smoker  . Smokeless tobacco: Never Used  Vaping Use  . Vaping Use: Never used  Substance and Sexual Activity  . Alcohol use: Yes    Comment: occ. social  . Drug use: No  . Sexual activity: Yes

## 2020-08-13 IMAGING — DX DG CHEST 2V
2 series · 2 of 2 positions shown · non-contrast
Comparison: None.

CLINICAL DATA: 72-year-old male with a history of preop chest x-ray

EXAM:
CHEST - 2 VIEW

[chest pa]
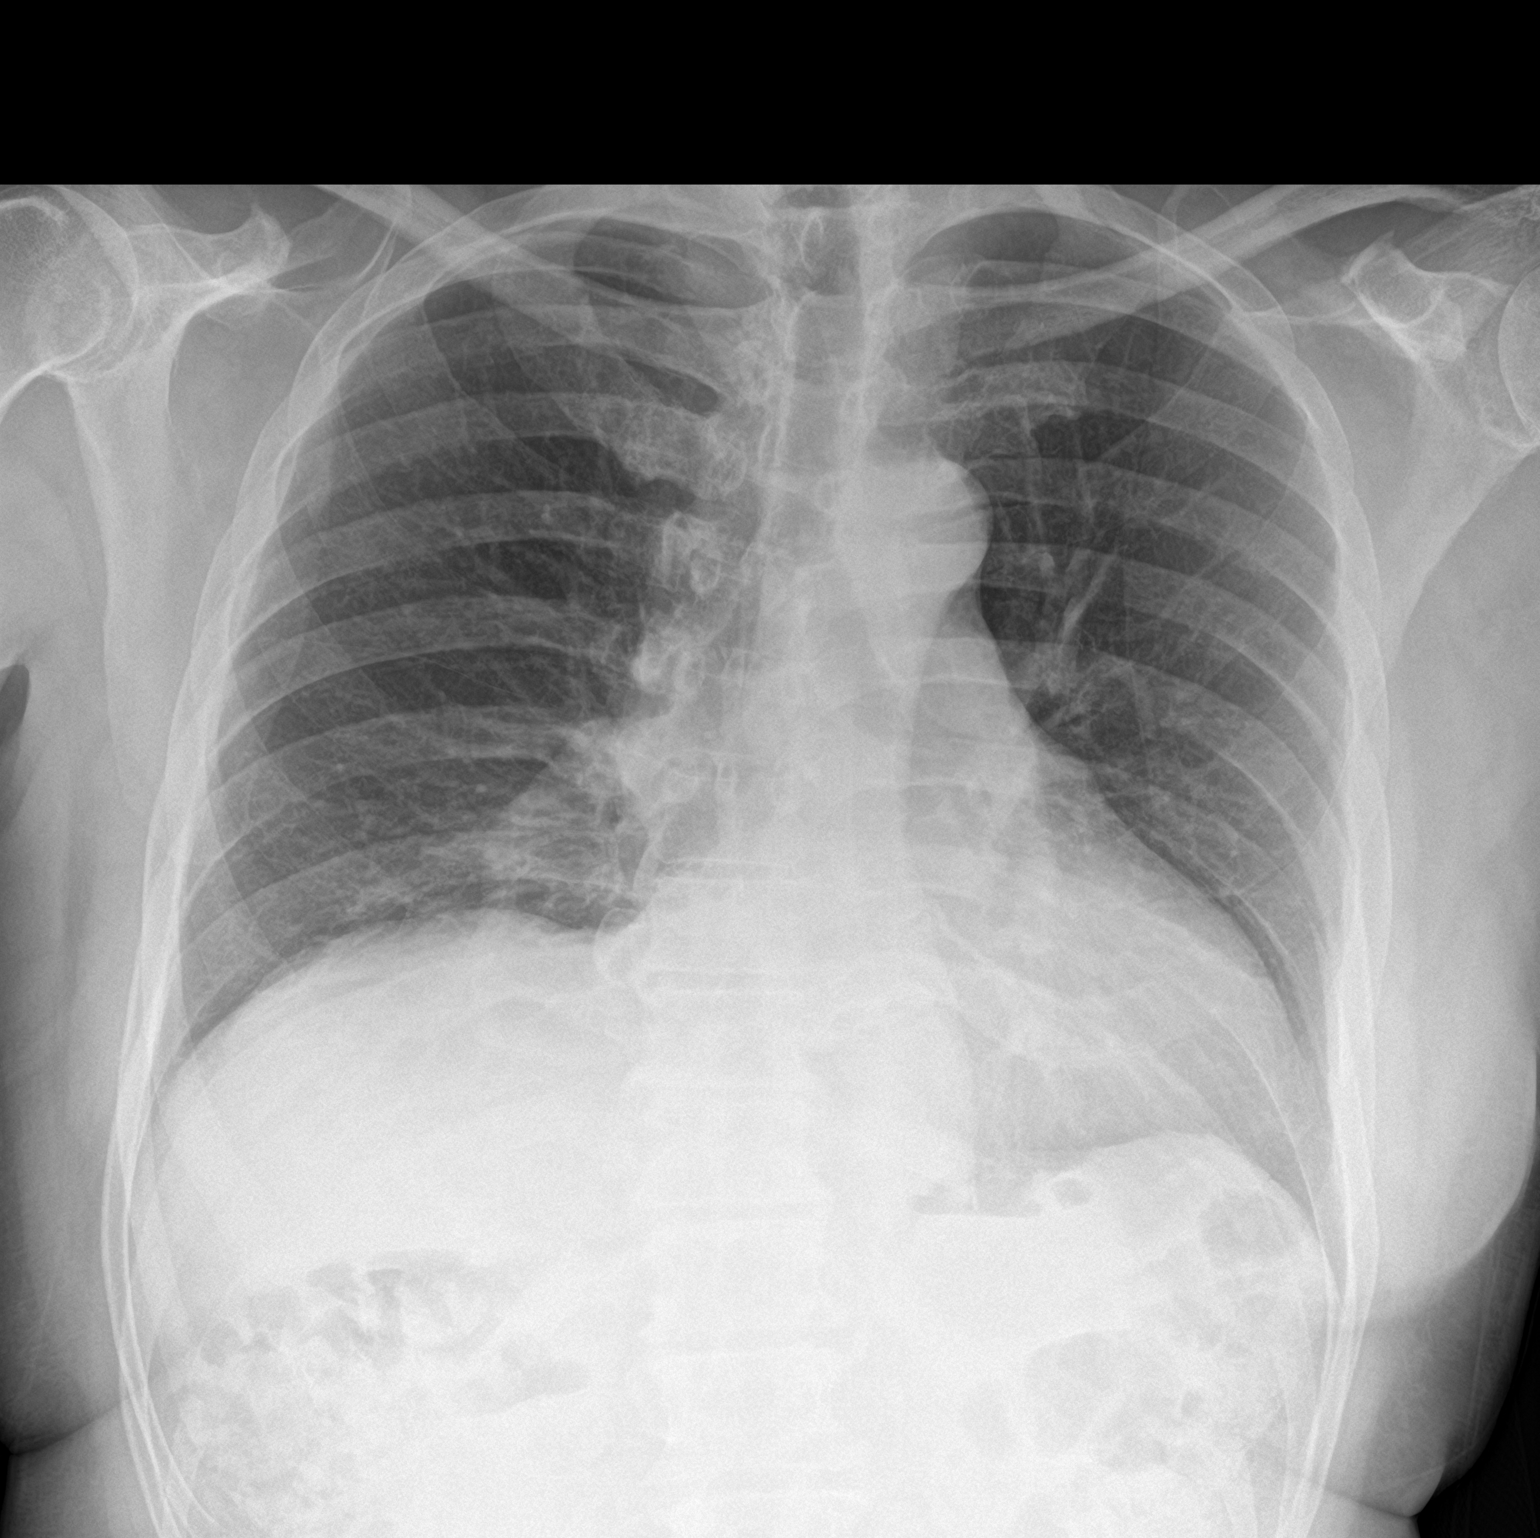

[chest lat]
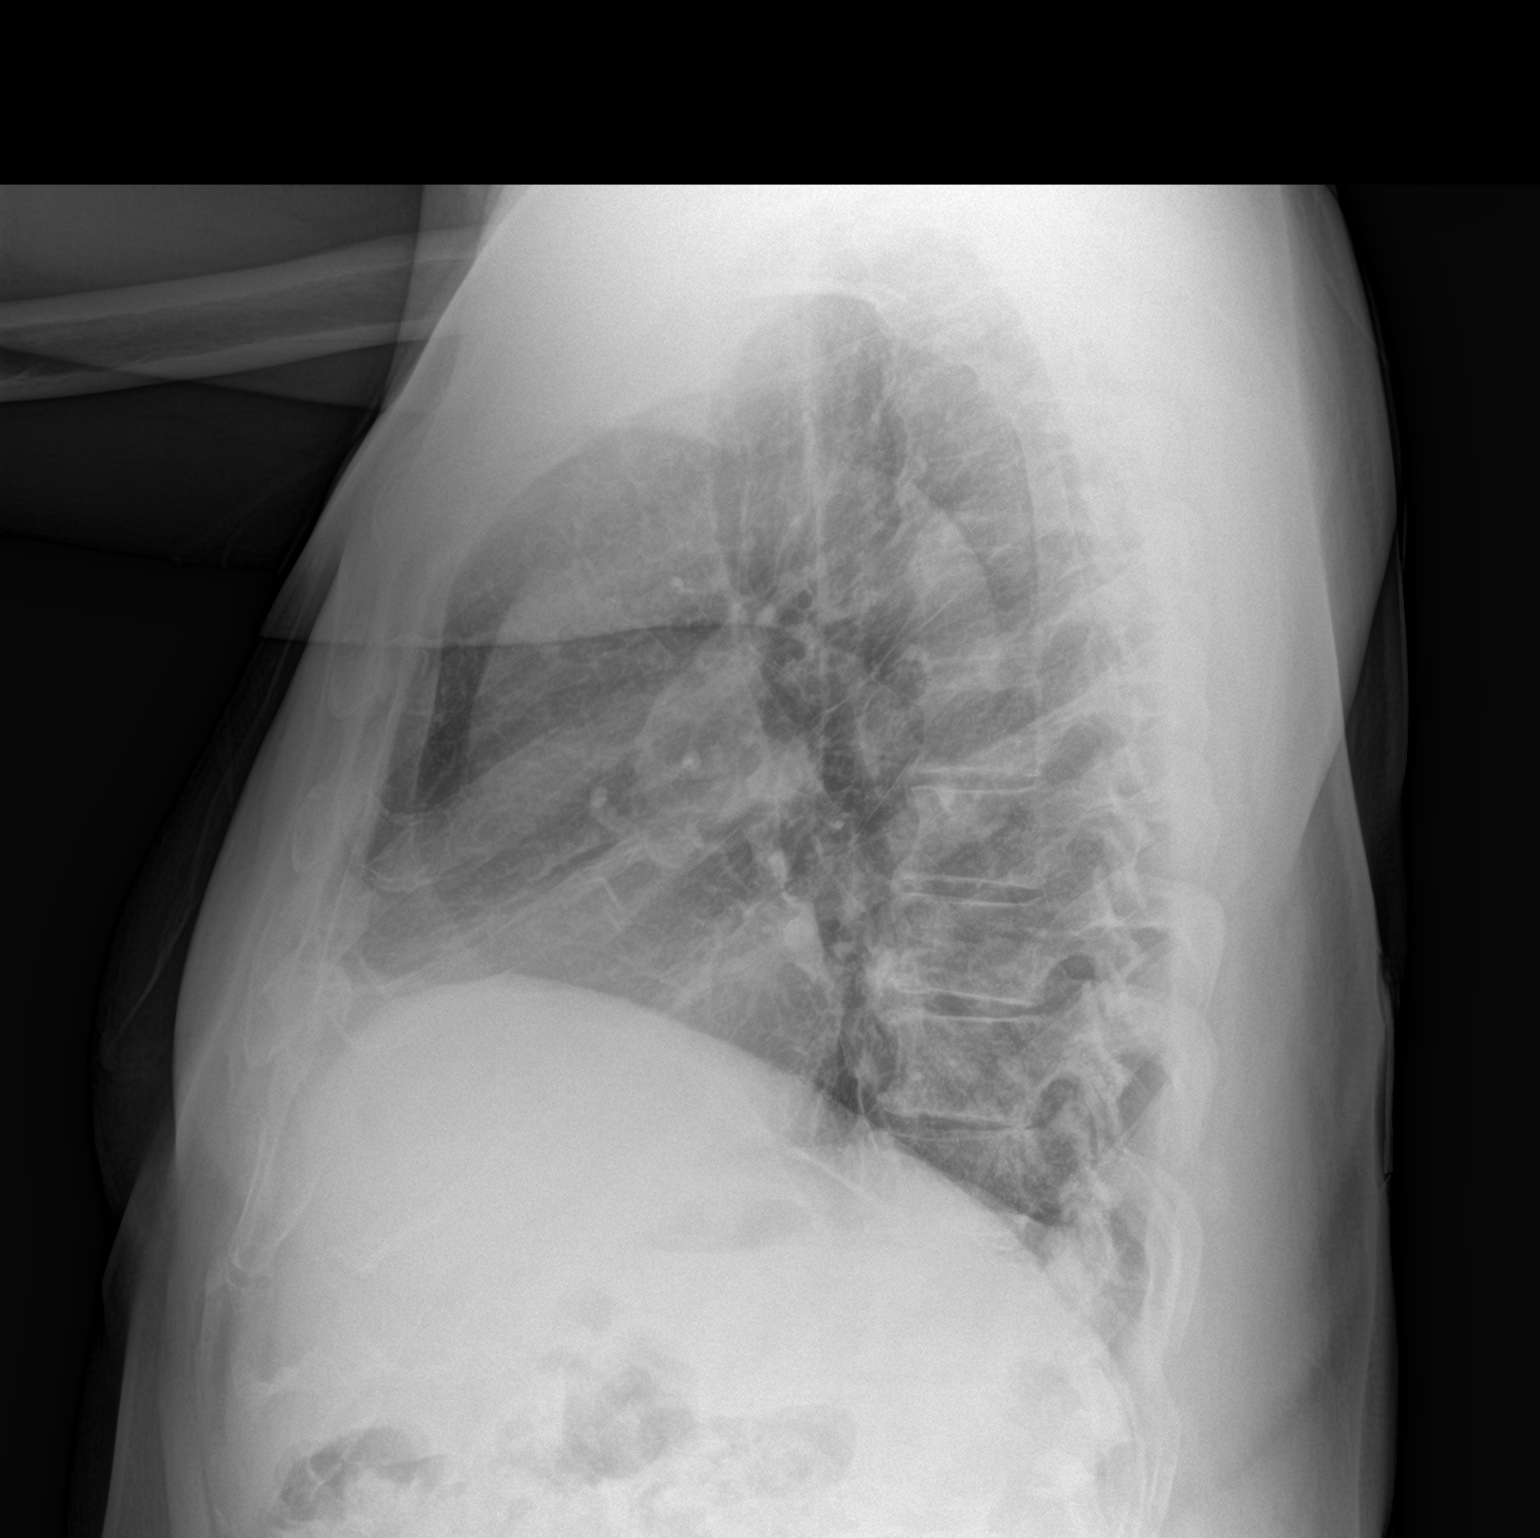

[2 of 2 positions shown; findings below may reference images not displayed]

FINDINGS: Cardiomediastinal silhouette within normal limits.

Asymmetric elevation of the right hemidiaphragm with no comparison
available. No pleural effusion. No pneumothorax. No confluent
airspace disease. Degenerative changes of the spine without
displaced fracture.
IMPRESSION: Negative for acute cardiopulmonary disease, with asymmetric
elevation of the right hemidiaphragm.

## 2020-09-08 ENCOUNTER — Ambulatory Visit: Payer: Medicare Other | Admitting: Orthopaedic Surgery

## 2020-09-09 DIAGNOSIS — R030 Elevated blood-pressure reading, without diagnosis of hypertension: Secondary | ICD-10-CM | POA: Diagnosis not present

## 2020-09-09 DIAGNOSIS — M25562 Pain in left knee: Secondary | ICD-10-CM | POA: Diagnosis not present

## 2020-09-09 DIAGNOSIS — Z1389 Encounter for screening for other disorder: Secondary | ICD-10-CM | POA: Diagnosis not present

## 2020-09-09 DIAGNOSIS — Z0000 Encounter for general adult medical examination without abnormal findings: Secondary | ICD-10-CM | POA: Diagnosis not present

## 2020-09-09 DIAGNOSIS — E7800 Pure hypercholesterolemia, unspecified: Secondary | ICD-10-CM | POA: Diagnosis not present

## 2020-09-09 DIAGNOSIS — I480 Paroxysmal atrial fibrillation: Secondary | ICD-10-CM | POA: Diagnosis not present

## 2020-09-11 DIAGNOSIS — Z96642 Presence of left artificial hip joint: Secondary | ICD-10-CM | POA: Diagnosis not present

## 2020-09-11 DIAGNOSIS — M1712 Unilateral primary osteoarthritis, left knee: Secondary | ICD-10-CM | POA: Diagnosis not present

## 2020-09-11 DIAGNOSIS — M7062 Trochanteric bursitis, left hip: Secondary | ICD-10-CM | POA: Diagnosis not present

## 2020-09-11 DIAGNOSIS — M25462 Effusion, left knee: Secondary | ICD-10-CM | POA: Diagnosis not present

## 2020-10-02 DIAGNOSIS — M7062 Trochanteric bursitis, left hip: Secondary | ICD-10-CM | POA: Diagnosis not present

## 2020-10-02 DIAGNOSIS — Z96642 Presence of left artificial hip joint: Secondary | ICD-10-CM | POA: Diagnosis not present

## 2020-10-02 DIAGNOSIS — M25462 Effusion, left knee: Secondary | ICD-10-CM | POA: Diagnosis not present

## 2020-10-02 DIAGNOSIS — M1712 Unilateral primary osteoarthritis, left knee: Secondary | ICD-10-CM | POA: Diagnosis not present

## 2020-11-04 DIAGNOSIS — M1712 Unilateral primary osteoarthritis, left knee: Secondary | ICD-10-CM | POA: Diagnosis not present

## 2020-11-04 DIAGNOSIS — M7062 Trochanteric bursitis, left hip: Secondary | ICD-10-CM | POA: Diagnosis not present

## 2021-03-30 ENCOUNTER — Ambulatory Visit: Payer: Medicare Other | Admitting: Orthopaedic Surgery

## 2021-04-02 DIAGNOSIS — M25562 Pain in left knee: Secondary | ICD-10-CM | POA: Diagnosis not present

## 2021-04-02 DIAGNOSIS — M1711 Unilateral primary osteoarthritis, right knee: Secondary | ICD-10-CM | POA: Diagnosis not present

## 2021-04-02 DIAGNOSIS — M1712 Unilateral primary osteoarthritis, left knee: Secondary | ICD-10-CM | POA: Diagnosis not present

## 2021-04-02 DIAGNOSIS — M25561 Pain in right knee: Secondary | ICD-10-CM | POA: Diagnosis not present

## 2021-04-09 DIAGNOSIS — H2512 Age-related nuclear cataract, left eye: Secondary | ICD-10-CM | POA: Diagnosis not present

## 2021-04-09 DIAGNOSIS — H401131 Primary open-angle glaucoma, bilateral, mild stage: Secondary | ICD-10-CM | POA: Diagnosis not present

## 2021-05-01 ENCOUNTER — Emergency Department (HOSPITAL_COMMUNITY): Payer: Medicare Other

## 2021-05-01 ENCOUNTER — Emergency Department (HOSPITAL_COMMUNITY)
Admission: EM | Admit: 2021-05-01 | Discharge: 2021-05-02 | Disposition: A | Discharge status: Home / Self Care | Payer: Medicare Other | Attending: Emergency Medicine | Admitting: Emergency Medicine

## 2021-05-01 ENCOUNTER — Encounter (HOSPITAL_COMMUNITY): Payer: Self-pay

## 2021-05-01 DIAGNOSIS — R112 Nausea with vomiting, unspecified: Secondary | ICD-10-CM | POA: Diagnosis not present

## 2021-05-01 DIAGNOSIS — F1090 Alcohol use, unspecified, uncomplicated: Secondary | ICD-10-CM | POA: Diagnosis not present

## 2021-05-01 DIAGNOSIS — Y903 Blood alcohol level of 60-79 mg/100 ml: Secondary | ICD-10-CM | POA: Insufficient documentation

## 2021-05-01 DIAGNOSIS — I10 Essential (primary) hypertension: Secondary | ICD-10-CM | POA: Diagnosis not present

## 2021-05-01 DIAGNOSIS — Z8546 Personal history of malignant neoplasm of prostate: Secondary | ICD-10-CM | POA: Diagnosis not present

## 2021-05-01 DIAGNOSIS — R55 Syncope and collapse: Secondary | ICD-10-CM | POA: Diagnosis not present

## 2021-05-01 DIAGNOSIS — R1111 Vomiting without nausea: Secondary | ICD-10-CM | POA: Diagnosis not present

## 2021-05-01 DIAGNOSIS — J9811 Atelectasis: Secondary | ICD-10-CM | POA: Diagnosis not present

## 2021-05-01 DIAGNOSIS — R519 Headache, unspecified: Secondary | ICD-10-CM | POA: Insufficient documentation

## 2021-05-01 DIAGNOSIS — I959 Hypotension, unspecified: Secondary | ICD-10-CM | POA: Diagnosis not present

## 2021-05-01 DIAGNOSIS — R001 Bradycardia, unspecified: Secondary | ICD-10-CM | POA: Diagnosis not present

## 2021-05-01 LAB — COMPREHENSIVE METABOLIC PANEL
ALT: 18 U/L (ref 0–44)
AST: 20 U/L (ref 15–41)
Albumin: 3.7 g/dL (ref 3.5–5.0)
Alkaline Phosphatase: 56 U/L (ref 38–126)
Anion gap: 9 (ref 5–15)
BUN: 20 mg/dL (ref 8–23)
CO2: 25 mmol/L (ref 22–32)
Calcium: 9.3 mg/dL (ref 8.9–10.3)
Chloride: 105 mmol/L (ref 98–111)
Creatinine, Ser: 1.37 mg/dL — ABNORMAL HIGH (ref 0.61–1.24)
GFR, Estimated: 54 mL/min — ABNORMAL LOW (ref >60)
Glucose, Bld: 119 mg/dL — ABNORMAL HIGH (ref 70–99)
Potassium: 4.2 mmol/L (ref 3.5–5.1)
Sodium: 139 mmol/L (ref 135–145)
Total Bilirubin: 0.7 mg/dL (ref 0.3–1.2)
Total Protein: 7 g/dL (ref 6.5–8.1)

## 2021-05-01 LAB — CBC WITH DIFFERENTIAL/PLATELET
Abs Immature Granulocytes: 0.02 10*3/uL (ref 0.00–0.07)
Basophils Absolute: 0.1 10*3/uL (ref 0.0–0.1)
Basophils Relative: 1 %
Eosinophils Absolute: 0.1 10*3/uL (ref 0.0–0.5)
Eosinophils Relative: 2 %
HCT: 41 % (ref 39.0–52.0)
Hemoglobin: 13.4 g/dL (ref 13.0–17.0)
Immature Granulocytes: 0 %
Lymphocytes Relative: 31 %
Lymphs Abs: 1.5 10*3/uL (ref 0.7–4.0)
MCH: 32.4 pg (ref 26.0–34.0)
MCHC: 32.7 g/dL (ref 30.0–36.0)
MCV: 99.3 fL (ref 80.0–100.0)
Monocytes Absolute: 0.4 10*3/uL (ref 0.1–1.0)
Monocytes Relative: 9 %
Neutro Abs: 2.7 10*3/uL (ref 1.7–7.7)
Neutrophils Relative %: 57 %
Platelets: 170 10*3/uL (ref 150–400)
RBC: 4.13 MIL/uL — ABNORMAL LOW (ref 4.22–5.81)
RDW: 14.3 % (ref 11.5–15.5)
WBC: 4.8 10*3/uL (ref 4.0–10.5)
nRBC: 0 % (ref 0.0–0.2)

## 2021-05-01 LAB — LIPASE, BLOOD: Lipase: 33 U/L (ref 11–51)

## 2021-05-01 LAB — TROPONIN I (HIGH SENSITIVITY)
Troponin I (High Sensitivity): 7 ng/L (ref <18)
Troponin I (High Sensitivity): 6 ng/L (ref <18)

## 2021-05-01 LAB — ETHANOL: Alcohol, Ethyl (B): 63 mg/dL — ABNORMAL HIGH (ref <10)

## 2021-05-01 MED ORDER — SODIUM CHLORIDE 0.9 % IV BOLUS
1000.0000 mL | Freq: Once | INTRAVENOUS | Status: AC
  Administered 2021-05-01: 1000 mL via INTRAVENOUS

## 2021-05-01 NOTE — Discharge Instructions (Signed)

## 2021-05-01 NOTE — ED Triage Notes (Signed)
Pt bib [facility] from home d/t Syncopal episode. Pt was diaphoretic, altered and had a vomiting episode with EMS. Pt had 1 drink in. EKG shows brady cardiac in the 50s. No Known PMH. Pt A&O X4.  ?

## 2021-05-01 NOTE — ED Provider Notes (Signed)
? ?Emergency Department Provider Note ? ? ?I have reviewed the triage vital signs and the nursing notes. ? ? ?HISTORY ? ?Chief Complaint ?Loss of Consciousness ? ? ?HPI ?Brandon Dyer is a 76 y.o. male with past history reviewed below presents to the emergency department with syncope episode at home.  Patient was celebrating his birthday with family.  He had a single drink, which is not typical for him, and shortly after he woke up on the floor.  He states everyone was standing around him and staring and states that he passed out.  No report of witnessed seizure activity.  Patient denies any pain.  No prodrome.  He was given some IV fluids and is feeling improved.  No chest pain, palpitations, shortness of breath currently.  He does have a mild headache.  No history of syncope in the past. ? ?EMS reportedly did have some nausea and vomiting on scene.  No deterioration in mental status with them. ? ? ?Past Medical History:  ?Diagnosis Date  ? Cancer ([place]) 12-01-11  ? prostate- dx. 2008-Radiation tx.  ? Epidermal cyst   ? on back  ? GERD (gastroesophageal reflux disease)   ? occ. -uses OTC med-PRILOSEC  ? Glaucoma   ? ? ?Review of Systems ? ?Constitutional: No fever/chills ?Eyes: No visual changes. ?ENT: No sore throat. ?Cardiovascular: Denies chest pain. Positive syncope.  ?Respiratory: Denies shortness of breath. ?Gastrointestinal: No abdominal pain.  Positive nausea and vomiting.  No diarrhea.  No constipation. ?Genitourinary: Negative for dysuria. ?Musculoskeletal: Negative for back pain. ?Skin: Negative for rash. ?Neurological: Positive mild HA.  ? ?____________________________________________ ? ? ?PHYSICAL EXAM: ? ?VITAL SIGNS: ?Vitals:  ? 05/02/21 0000 05/02/21 0002  ?BP: 117/75 117/75  ?Pulse: 69 65  ?Resp: (!) 23 (!) 21  ?Temp:  97.9 ?F (36.6 ?C)  ?SpO2: 91% 95%  ? ? ?Constitutional: Alert and oriented. Well appearing and in no acute distress. ?Eyes: Conjunctivae are normal.  ?Head: Atraumatic. ?Nose: No  congestion/rhinnorhea. ?Mouth/Throat: Mucous membranes are moist.   ?Neck: No stridor.   ?Cardiovascular: Normal rate, regular rhythm. Good peripheral circulation. Grossly normal heart sounds.   ?Respiratory: Normal respiratory effort.  No retractions. Lungs CTAB. ?Gastrointestinal: Soft and nontender. No distention.  ?Musculoskeletal: No lower extremity tenderness nor edema. No gross deformities of extremities. ?Neurologic:  Normal speech and language. No gross focal neurologic deficits are appreciated.  ?Skin:  Skin is warm, dry and intact. No rash noted. ? ? ?____________________________________________ ?  ?LABS ?(all labs ordered are listed, but only abnormal results are displayed) ? ?Labs Reviewed  ?COMPREHENSIVE METABOLIC PANEL - Abnormal; Notable for the following components:  ?    Result Value  ? Glucose, Bld 119 (*)   ? Creatinine, Ser 1.37 (*)   ? GFR, Estimated 54 (*)   ? All other components within normal limits  ?CBC WITH DIFFERENTIAL/PLATELET - Abnormal; Notable for the following components:  ? RBC 4.13 (*)   ? All other components within normal limits  ?ETHANOL - Abnormal; Notable for the following components:  ? Alcohol, Ethyl (B) 63 (*)   ? All other components within normal limits  ?LIPASE, BLOOD  ?TROPONIN I (HIGH SENSITIVITY)  ?TROPONIN I (HIGH SENSITIVITY)  ? ?____________________________________________ ? ?EKG ? ? EKG Interpretation ? ?Date/Time:  Friday May 01 2021 21:02:26 EDT ?Ventricular Rate:  67 ?PR Interval:  137 ?QRS Duration: 92 ?QT Interval:  396 ?QTC Calculation: 418 ?R Axis:   48 ?Text Interpretation: Sinus rhythm Abnormal R-wave progression, early transition  Borderline repolarization abnormality Similar to 2019 tracing Confirmed by Nanda Quinton [phone]) on 05/01/2021 9:06:25 PM ?  ? ?  ? ? ?____________________________________________ ? ?RADIOLOGY ? ?DG Chest 2 View ? ?Result Date: 05/01/2021 ?CLINICAL DATA:  Syncope. EXAM: CHEST - 2 VIEW COMPARISON:  November 10, 2017 FINDINGS: Low  lung volumes are seen. Mild atelectasis is noted along the lateral aspect of the left lung base. There is no evidence of a pleural effusion or pneumothorax. The heart size and mediastinal contours are within normal limits. Multilevel degenerative changes are noted throughout the thoracic spine. IMPRESSION: Low lung volumes with mild left basilar atelectasis. Electronically Signed   By: Virgina Norfolk M.D.   On: 05/01/2021 21:41  ? ?CT Head Wo Contrast ? ?Result Date: 05/01/2021 ?CLINICAL DATA:  LOC mental status change EXAM: CT HEAD WITHOUT CONTRAST TECHNIQUE: Contiguous axial images were obtained from the base of the skull through the vertex without intravenous contrast. RADIATION DOSE REDUCTION: This exam was performed according to the departmental dose-optimization program which includes automated exposure control, adjustment of the mA and/or kV according to patient size and/or use of iterative reconstruction technique. COMPARISON:  None. FINDINGS: Brain: No acute territorial infarction, hemorrhage or intracranial mass. The ventricles are nonenlarged. Vascular: No hyperdense vessels.  No unexpected calcification Skull: Normal. Negative for fracture or focal lesion. Sinuses/Orbits: No acute finding. Other: None IMPRESSION: Negative non contrasted CT appearance of the brain Electronically Signed   By: Donavan Foil M.D.   On: 05/01/2021 21:31   ? ?____________________________________________ ? ? ?PROCEDURES ? ?Procedure(s) performed:  ? ?Procedures ? ?None ?____________________________________________ ? ? ?INITIAL IMPRESSION / ASSESSMENT AND PLAN / ED COURSE ? ?Pertinent labs & imaging results that were available during my care of the patient were reviewed by me and considered in my medical decision making (see chart for details). ?  ?This patient is Presenting for Evaluation of syncope, which does require a range of treatment options, and is a complaint that involves a high risk of morbidity and mortality. ? ?The  Differential Diagnoses include vasovagal syncope, cardiac arrhythmia, dehydration, EtOH intoxication, seizure. ? ?Critical Interventions-  ?  ?Medications  ?sodium chloride 0.9 % bolus 1,000 mL (0 mLs Intravenous Stopped 05/02/21 0035)  ? ? ?Reassessment after intervention: Patient feeling improved. ? ? ?I did obtain Additional Historical Information from EMS. ? ?I decided to review pertinent External Data, and in summary no prior ED visits for similar. ?  ?Clinical Laboratory Tests Ordered, included troponin within normal limits.  Second troponin in process.  Lipase normal.  No leukocytosis or anemia.  Mild elevation in creatinine at 1.37. ? ?Radiologic Tests Ordered, included CT head and CXR. I independently interpreted the images and agree with radiology interpretation.  ? ?Cardiac Monitor Tracing which shows NSR. ? ? ?Social Determinants of Health Risk no smoking history. ? ?Medical Decision Making: Summary:  ?Patient presents to the emergency department with syncope.  No prodrome.  He did have a small amount of alcohol but doubt this is a major factor.  CT imaging of the head with no bleed or fracture.  Chest x-ray is clear.  EKG is reassuring.  Plan for IV fluids and second troponin.  ? ?Reevaluation with update and discussion with patient and family. He is feeling much better. No ectopy or arrhythmia on tele here. Labs reassuring. Feeling better after IVF. Plan for discharge with strict ED return precautions and PCP follow up plan.  ? ?Disposition: discharge ? ?____________________________________________ ? ?FINAL CLINICAL IMPRESSION(S) / ED DIAGNOSES ? ?  Final diagnoses:  ?Syncope and collapse  ? ? ?Note:  This document was prepared using Dragon voice recognition software and may include unintentional dictation errors. ? ?Nanda Quinton, MD, FACEP ?Emergency Medicine ? ?  ?Margette Fast, MD ?05/02/21 1742 ? ?

## 2021-05-25 DIAGNOSIS — M1711 Unilateral primary osteoarthritis, right knee: Secondary | ICD-10-CM | POA: Diagnosis not present

## 2021-05-25 DIAGNOSIS — M1712 Unilateral primary osteoarthritis, left knee: Secondary | ICD-10-CM | POA: Diagnosis not present

## 2021-05-25 DIAGNOSIS — M170 Bilateral primary osteoarthritis of knee: Secondary | ICD-10-CM | POA: Diagnosis not present

## 2021-06-01 DIAGNOSIS — M1712 Unilateral primary osteoarthritis, left knee: Secondary | ICD-10-CM | POA: Diagnosis not present

## 2021-06-01 DIAGNOSIS — M1711 Unilateral primary osteoarthritis, right knee: Secondary | ICD-10-CM | POA: Diagnosis not present

## 2021-06-08 DIAGNOSIS — M1711 Unilateral primary osteoarthritis, right knee: Secondary | ICD-10-CM | POA: Diagnosis not present

## 2021-06-08 DIAGNOSIS — M1712 Unilateral primary osteoarthritis, left knee: Secondary | ICD-10-CM | POA: Diagnosis not present

## 2021-10-01 DIAGNOSIS — B351 Tinea unguium: Secondary | ICD-10-CM | POA: Diagnosis not present

## 2021-10-01 DIAGNOSIS — Z0000 Encounter for general adult medical examination without abnormal findings: Secondary | ICD-10-CM | POA: Diagnosis not present

## 2021-10-01 DIAGNOSIS — M25511 Pain in right shoulder: Secondary | ICD-10-CM | POA: Diagnosis not present

## 2021-10-01 DIAGNOSIS — I480 Paroxysmal atrial fibrillation: Secondary | ICD-10-CM | POA: Diagnosis not present

## 2021-10-01 DIAGNOSIS — R7989 Other specified abnormal findings of blood chemistry: Secondary | ICD-10-CM | POA: Diagnosis not present

## 2021-10-01 DIAGNOSIS — G8929 Other chronic pain: Secondary | ICD-10-CM | POA: Diagnosis not present

## 2021-10-01 DIAGNOSIS — E7800 Pure hypercholesterolemia, unspecified: Secondary | ICD-10-CM | POA: Diagnosis not present

## 2021-10-01 DIAGNOSIS — Z23 Encounter for immunization: Secondary | ICD-10-CM | POA: Diagnosis not present

## 2021-10-12 DIAGNOSIS — H401131 Primary open-angle glaucoma, bilateral, mild stage: Secondary | ICD-10-CM | POA: Diagnosis not present

## 2021-10-14 DIAGNOSIS — Z0000 Encounter for general adult medical examination without abnormal findings: Secondary | ICD-10-CM | POA: Diagnosis not present

## 2021-11-12 DIAGNOSIS — M1711 Unilateral primary osteoarthritis, right knee: Secondary | ICD-10-CM | POA: Diagnosis not present

## 2021-11-12 DIAGNOSIS — M25511 Pain in right shoulder: Secondary | ICD-10-CM | POA: Diagnosis not present

## 2021-11-12 DIAGNOSIS — M67911 Unspecified disorder of synovium and tendon, right shoulder: Secondary | ICD-10-CM | POA: Diagnosis not present

## 2021-12-23 ENCOUNTER — Ambulatory Visit
Admission: EM | Admit: 2021-12-23 | Discharge: 2021-12-23 | Disposition: A | Discharge status: Home / Self Care | Payer: Medicare Other | Attending: Physician Assistant | Admitting: Physician Assistant

## 2021-12-23 DIAGNOSIS — Z1152 Encounter for screening for COVID-19: Secondary | ICD-10-CM

## 2021-12-23 DIAGNOSIS — J069 Acute upper respiratory infection, unspecified: Secondary | ICD-10-CM

## 2021-12-23 MED ORDER — BENZONATATE 100 MG PO CAPS: 100.0000 mg | ORAL_CAPSULE | Freq: Three times a day (TID) | ORAL | 0 refills | Status: AC

## 2021-12-23 NOTE — ED Triage Notes (Signed)
Pt c/o cough that started yesterday. States last night it was so bad once he started coughing he was unable to stop. He was awake until ~ 4am. States throughout the day today he cont to have coughing spells that he was unable to stop.

## 2021-12-23 NOTE — ED Provider Notes (Signed)
[facility]    CSN: [id number] Arrival date & time: 12/23/21  1358      History   Chief Complaint Chief Complaint  Patient presents with   Cough    HPI Brandon Dyer is a 76 y.o. male.   Patient here today for evaluation of cough that started last night.  He reports that he had difficulty sleeping due to his cough.  He has tried multiple over-the-counter medications without resolution.  He denies any fever.  He has had some nasal congestion and sore throat.  He denies any vomiting or diarrhea.  The history is provided by the patient.    Past Medical History:  Diagnosis Date   Cancer ([place]) 12-01-11   prostate- dx. 2008-Radiation tx.   Epidermal cyst    on back   GERD (gastroesophageal reflux disease)    occ. -uses OTC med-PRILOSEC   Glaucoma     Patient Active Problem List   Diagnosis Date Noted   Chronic pain 01/29/2020   ED (erectile dysfunction) of organic origin 01/29/2020   Gastroesophageal reflux disease 01/29/2020   Hearing loss 01/29/2020   History of malignant neoplasm of prostate 01/29/2020   Primary localized osteoarthritis of pelvic region and thigh 01/29/2020   Seasonal allergic rhinitis 01/29/2020   Primary osteoarthritis, left ankle and foot 01/29/2020   Primary osteoarthritis of left hip 11/18/2017   Epidermal cyst 10/30/2017   Inguinal hernia unilateral, non-recurrent, left 10/18/2011   Allergic to bees 06/10/2008   Arthritis 06/10/2008   Fibroma 06/10/2008   Hypercholesterolemia 06/10/2008   Personal history of contact with and (suspected) exposure to asbestos 06/10/2008   Seasonal allergic reaction 06/10/2008    Past Surgical History:  Procedure Laterality Date   APPENDECTOMY     CATARACT EXTRACTION     cataract surgery     one eye   CYSTECTOMY     on back   HERNIA REPAIR  12/06/11   LIH repair with mesh   INGUINAL HERNIA REPAIR  12/06/2011   Procedure: HERNIA REPAIR INGUINAL ADULT;  Surgeon: Earnstine Regal, MD;   Location: [facility];  Service: General;  Laterality: Left;  Repair Left Inguinal Hernia with Mesh   INSERTION OF MESH  12/06/2011   Procedure: INSERTION OF MESH;  Surgeon: Earnstine Regal, MD;  Location: [facility];  Service: General;  Laterality: Left;   MASS EXCISION N/A 11/01/2017   Procedure: EXCISION OF EPIDERMAL CYSTS 2 FROM MID BACK;  Surgeon: Armandina Gemma, MD;  Location: [facility];  Service: General;  Laterality: N/A;   SPERMATOCELECTOMY Left 01/24/2018   Procedure: EXCISON OF LEFT INGUINAL CYST;  Surgeon: Irine Seal, MD;  Location: [facility];  Service: Urology;  Laterality: Left;   TOTAL HIP ARTHROPLASTY Left 11/18/2017   Procedure: TOTAL HIP ARTHROPLASTY ANTERIOR APPROACH;  Surgeon: Dorna Leitz, MD;  Location: [facility];  Service: Orthopedics;  Laterality: Left;       Home Medications    Prior to Admission medications   Medication Sig Start Date End Date Taking? Authorizing Provider  benzonatate (TESSALON) 100 MG capsule Take 1 capsule (100 mg total) by mouth every 8 (eight) hours. 12/23/21  Yes Francene Finders, PA-C  diclofenac (VOLTAREN) 75 MG EC tablet Take 1 tablet (75 mg total) by mouth 2 (two) times daily between meals as needed. 09/19/19   Mcarthur Rossetti, MD  latanoprost (XALATAN) 0.005 % ophthalmic solution 1 drop into affected eye in the evening    [provider]  loratadine (CLARITIN) 10 MG tablet 1 tablet    [provider]  methylPREDNISolone (MEDROL) 4 MG tablet Medrol dose pack. Take as instructed 09/19/19   Mcarthur Rossetti, MD  sildenafil (REVATIO) 20 MG tablet Take 20 mg by mouth daily as needed (for ED).     [provider]  tadalafil (CIALIS) 20 MG tablet 1 tablet 02/01/17   [provider]    Family History History reviewed. No pertinent family history.  Social History Social History   Tobacco Use   Smoking status: Never   Smokeless tobacco: Never  Vaping Use   Vaping Use: Never used  Substance Use  Topics   Alcohol use: Yes    Comment: occ. social   Drug use: No     Allergies   Other and Sildenafil citrate   Review of Systems Review of Systems  Constitutional:  Negative for chills and fever.  HENT:  Positive for congestion and sore throat. Negative for ear pain.   Eyes:  Negative for discharge and redness.  Respiratory:  Positive for cough. Negative for shortness of breath.   Gastrointestinal:  Negative for abdominal pain, diarrhea, nausea and vomiting.     Physical Exam Triage Vital Signs ED Triage Vitals  Enc Vitals Group     BP      Pulse      Resp      Temp      Temp src      SpO2      Weight      Height      Head Circumference      Peak Flow      Pain Score      Pain Loc      Pain Edu?      Excl. in [place]?    No data found.  Updated Vital Signs BP (!) 162/87 (BP Location: Left Arm)   Pulse 77   Temp 98 F (36.7 C) (Oral)   Resp 16   SpO2 98%       Physical Exam Vitals and nursing note reviewed.  Constitutional:      General: He is not in acute distress.    Appearance: He is well-developed. He is not ill-appearing.  HENT:     Head: Normocephalic and atraumatic.     Nose: Congestion present.     Mouth/Throat:     Mouth: Mucous membranes are moist.     Pharynx: Posterior oropharyngeal erythema present. No oropharyngeal exudate.  Eyes:     Conjunctiva/sclera: Conjunctivae normal.  Cardiovascular:     Rate and Rhythm: Normal rate and regular rhythm.     Heart sounds: Normal heart sounds. No murmur heard. Pulmonary:     Effort: Pulmonary effort is normal. No respiratory distress.     Breath sounds: Normal breath sounds. No wheezing, rhonchi or rales.  Skin:    General: Skin is warm and dry.  Neurological:     Mental Status: He is alert.  Psychiatric:        Mood and Affect: Mood normal.        Behavior: Behavior normal.      UC Treatments / Results  Labs (all labs ordered are listed, but only abnormal results are displayed) Labs  Reviewed  SARS CORONAVIRUS 2 (TAT 6-24 HRS)    EKG   Radiology No results found.  Procedures Procedures (including critical care time)  Medications Ordered in UC Medications - No data to display  Initial Impression / Assessment and  Plan / UC Course  I have reviewed the triage vital signs and the nursing notes.  Pertinent labs & imaging results that were available during my care of the patient were reviewed by me and considered in my medical decision making (see chart for details).    Tessalon Perles prescribed for cough suppression.  Will order COVID screening.  Suspect likely viral etiology of symptoms and recommended follow-up if no gradual improvement or with any further concerns.  Patient expresses understanding.  Final Clinical Impressions(s) / UC Diagnoses   Final diagnoses:  Acute upper respiratory infection  Encounter for screening for COVID-19   Discharge Instructions   None    ED Prescriptions     Medication Sig Dispense Auth. Provider   benzonatate (TESSALON) 100 MG capsule Take 1 capsule (100 mg total) by mouth every 8 (eight) hours. 21 capsule Francene Finders, PA-C      PDMP not reviewed this encounter.   Francene Finders, PA-C 12/23/21 1814

## 2021-12-24 LAB — SARS CORONAVIRUS 2 (TAT 6-24 HRS): SARS Coronavirus 2: NEGATIVE

## 2022-03-02 DIAGNOSIS — M79672 Pain in left foot: Secondary | ICD-10-CM | POA: Diagnosis not present

## 2022-03-02 DIAGNOSIS — M25552 Pain in left hip: Secondary | ICD-10-CM | POA: Diagnosis not present

## 2022-03-02 DIAGNOSIS — M722 Plantar fascial fibromatosis: Secondary | ICD-10-CM | POA: Diagnosis not present

## 2022-03-30 DIAGNOSIS — M7062 Trochanteric bursitis, left hip: Secondary | ICD-10-CM | POA: Diagnosis not present

## 2022-03-30 DIAGNOSIS — M722 Plantar fascial fibromatosis: Secondary | ICD-10-CM | POA: Diagnosis not present

## 2022-04-13 DIAGNOSIS — H2512 Age-related nuclear cataract, left eye: Secondary | ICD-10-CM | POA: Diagnosis not present

## 2022-04-13 DIAGNOSIS — H401131 Primary open-angle glaucoma, bilateral, mild stage: Secondary | ICD-10-CM | POA: Diagnosis not present

## 2022-07-27 DIAGNOSIS — M1711 Unilateral primary osteoarthritis, right knee: Secondary | ICD-10-CM | POA: Diagnosis not present

## 2022-08-03 DIAGNOSIS — M1711 Unilateral primary osteoarthritis, right knee: Secondary | ICD-10-CM | POA: Diagnosis not present

## 2022-08-10 DIAGNOSIS — M1711 Unilateral primary osteoarthritis, right knee: Secondary | ICD-10-CM | POA: Diagnosis not present

## 2022-08-10 DIAGNOSIS — M7632 Iliotibial band syndrome, left leg: Secondary | ICD-10-CM | POA: Diagnosis not present

## 2022-08-10 IMAGING — DX DG KNEE COMPLETE 4+V*R*
4 series · 4 of 4 positions shown · non-contrast
Comparison: None.

CLINICAL DATA: Right knee mass.

EXAM:
RIGHT KNEE - COMPLETE 4+ VIEW

[dg knee complete 4 views right (1 of 4)]
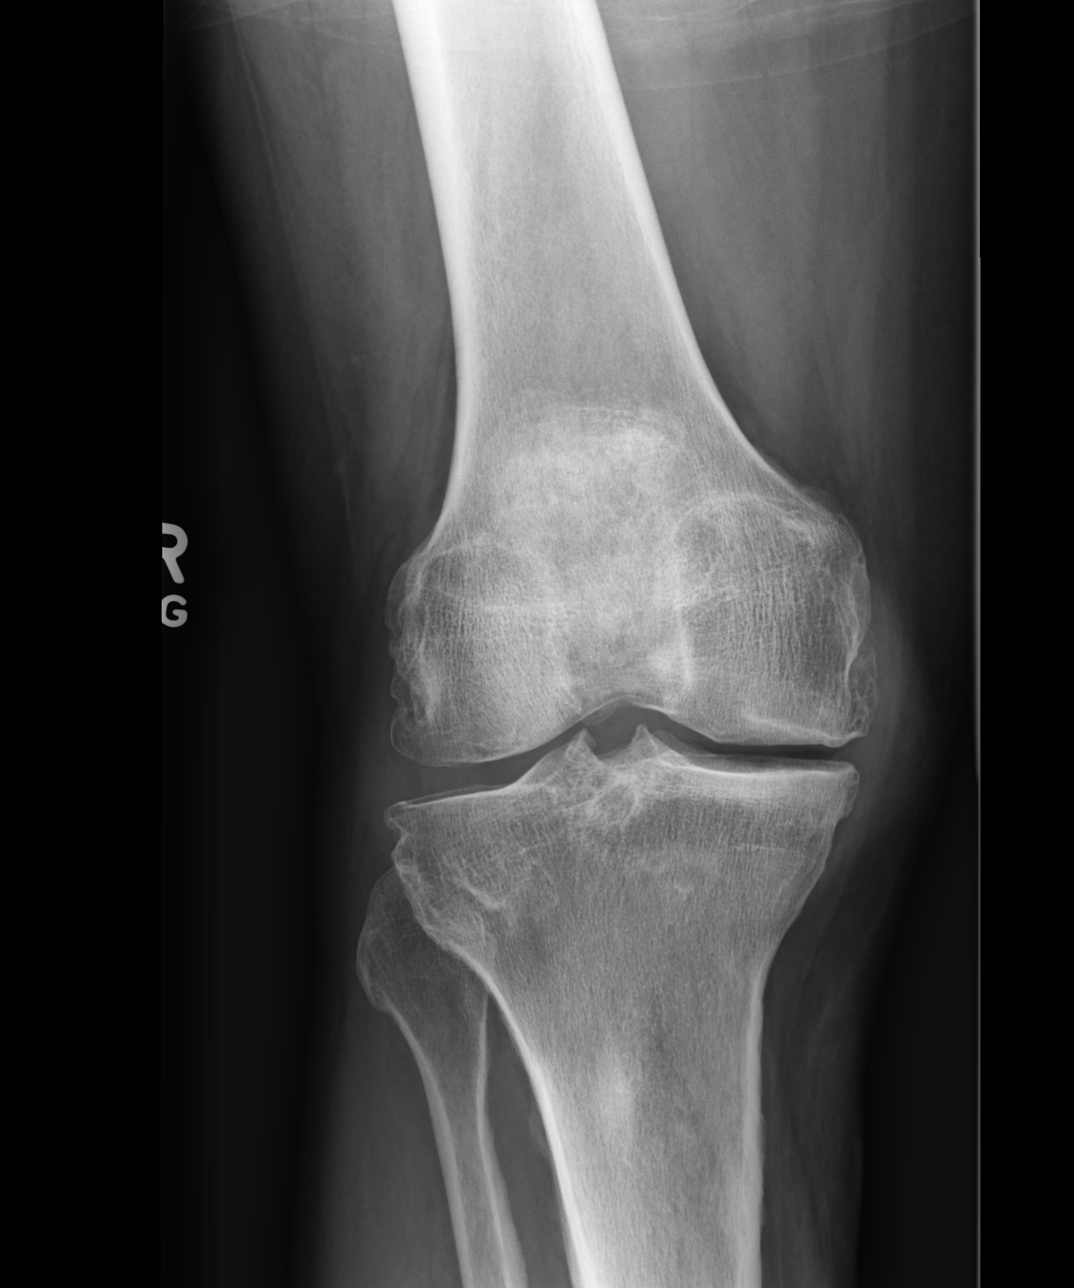

[dg knee complete 4 views right (2 of 4)]
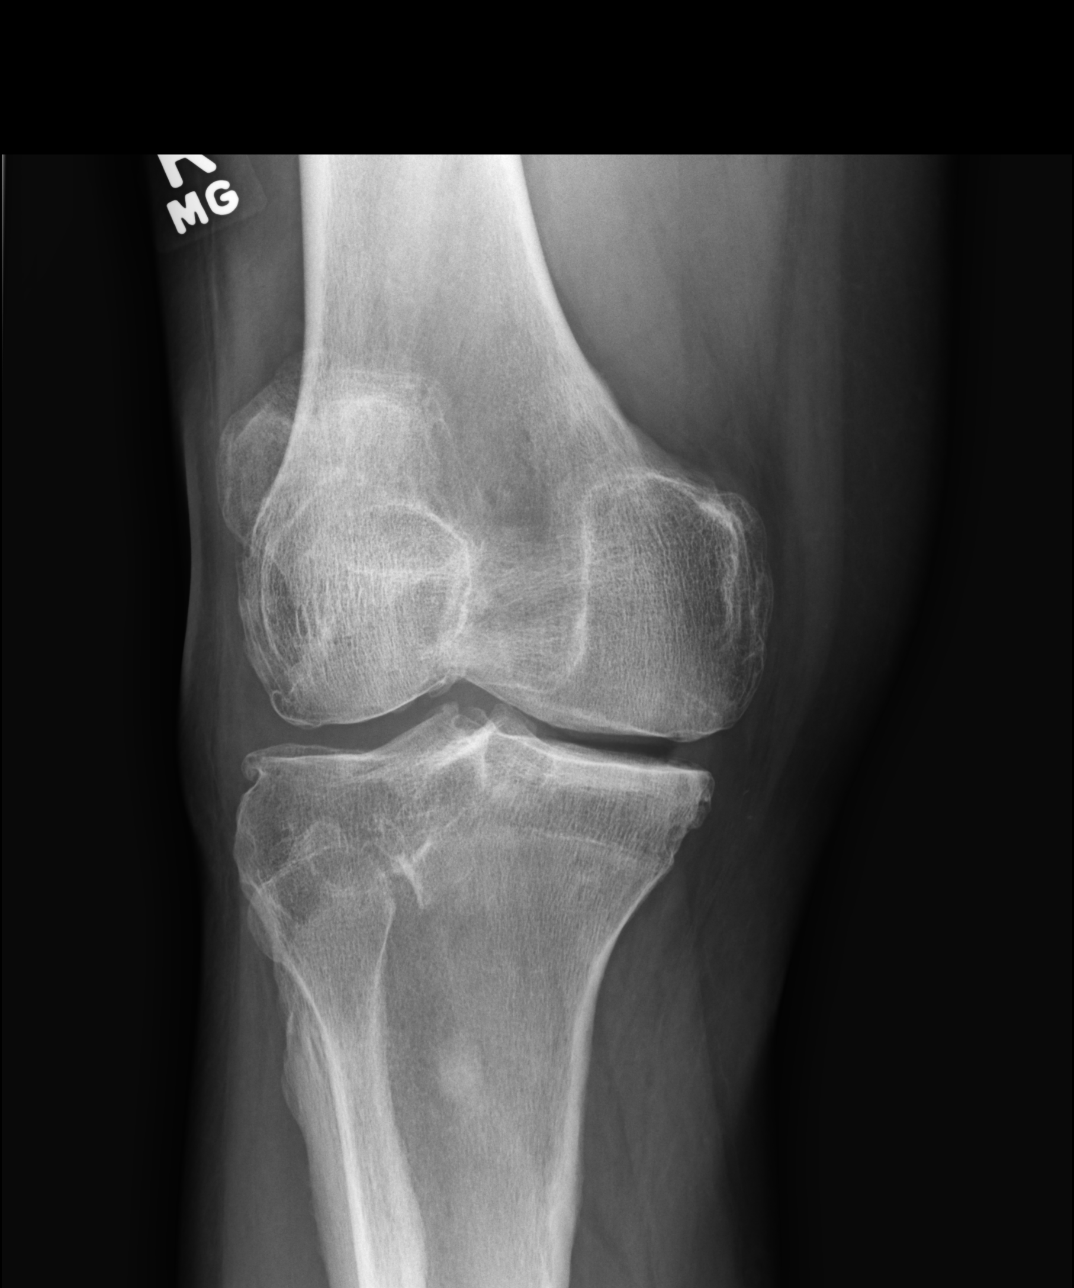

[dg knee complete 4 views right (3 of 4)]
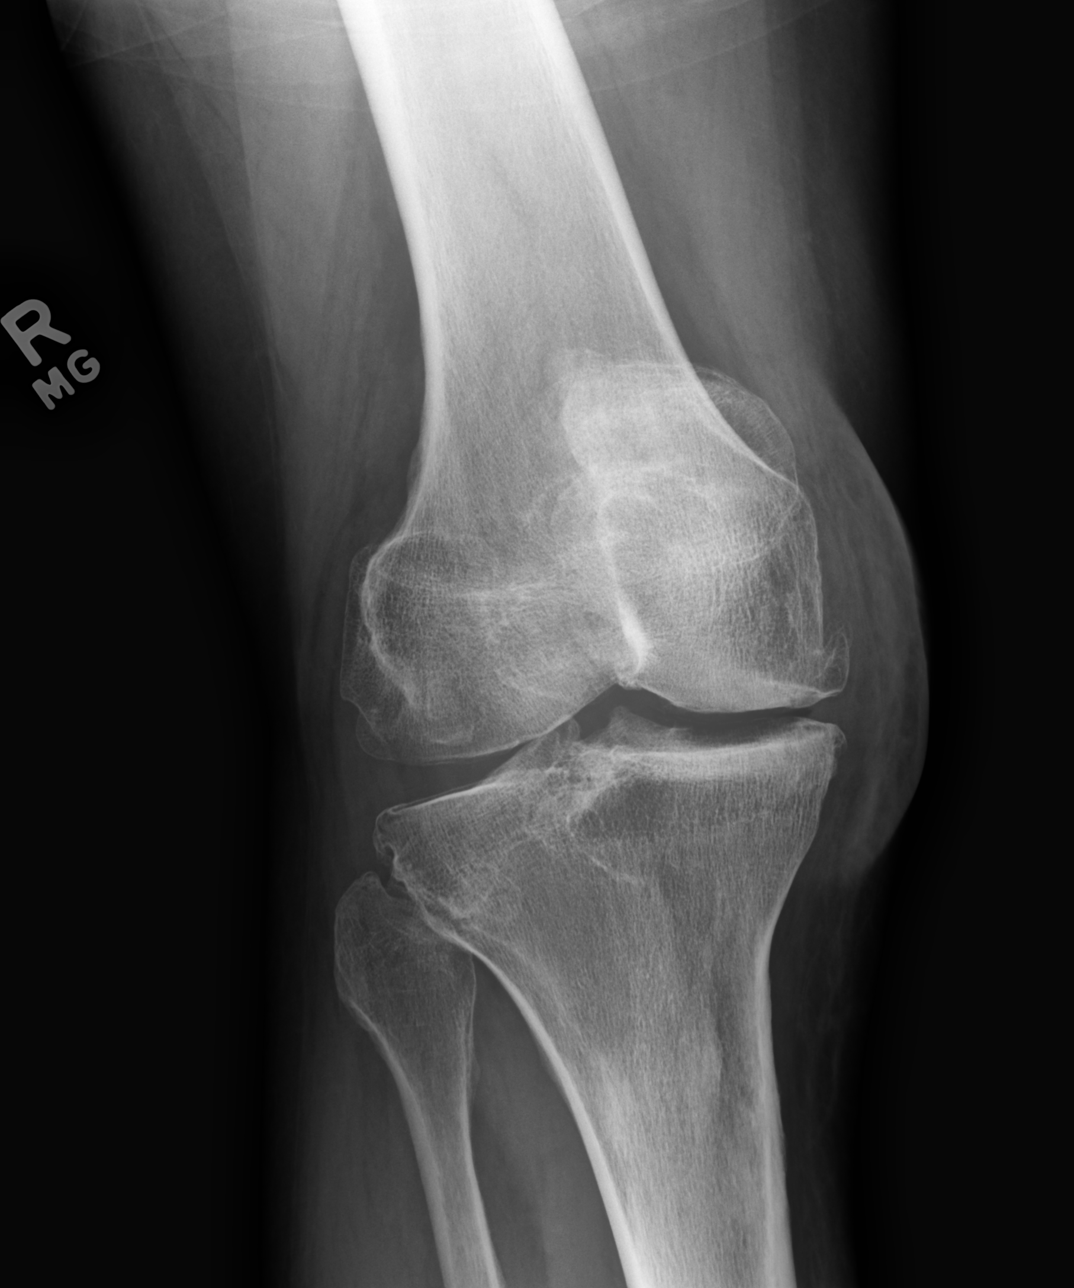

[dg knee complete 4 views right (4 of 4)]
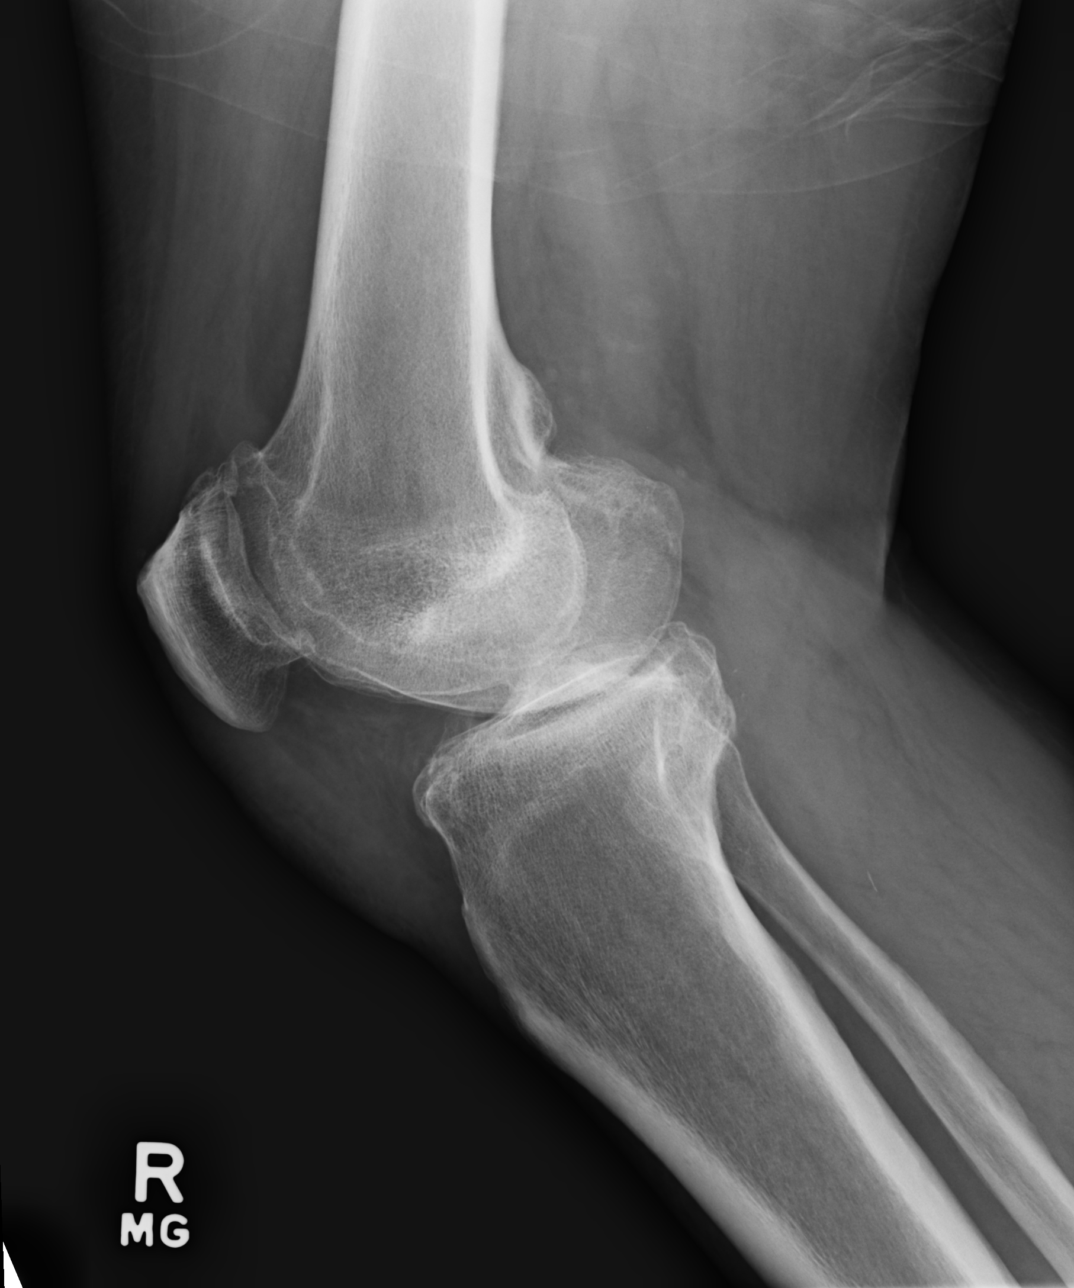

[4 of 4 positions shown; findings below may reference images not displayed]

FINDINGS: No evidence of fracture, dislocation, or joint effusion. Moderate
narrowing of medial joint space is noted with osteophyte formation.
Moderate narrowing of patellofemoral space is noted with osteophyte
formation. Soft tissues are unremarkable.
IMPRESSION: Moderate degenerative joint disease. No acute abnormality seen in
the right knee.

## 2022-10-19 DIAGNOSIS — M1711 Unilateral primary osteoarthritis, right knee: Secondary | ICD-10-CM | POA: Diagnosis not present

## 2022-10-19 DIAGNOSIS — M1712 Unilateral primary osteoarthritis, left knee: Secondary | ICD-10-CM | POA: Diagnosis not present

## 2022-10-19 DIAGNOSIS — M170 Bilateral primary osteoarthritis of knee: Secondary | ICD-10-CM | POA: Diagnosis not present

## 2022-10-28 DIAGNOSIS — R7303 Prediabetes: Secondary | ICD-10-CM | POA: Diagnosis not present

## 2022-10-28 DIAGNOSIS — E7800 Pure hypercholesterolemia, unspecified: Secondary | ICD-10-CM | POA: Diagnosis not present

## 2022-10-28 DIAGNOSIS — Z1211 Encounter for screening for malignant neoplasm of colon: Secondary | ICD-10-CM | POA: Diagnosis not present

## 2022-10-28 DIAGNOSIS — Z79899 Other long term (current) drug therapy: Secondary | ICD-10-CM | POA: Diagnosis not present

## 2022-10-28 DIAGNOSIS — R051 Acute cough: Secondary | ICD-10-CM | POA: Diagnosis not present

## 2022-10-28 DIAGNOSIS — Z23 Encounter for immunization: Secondary | ICD-10-CM | POA: Diagnosis not present

## 2022-10-28 DIAGNOSIS — Z0000 Encounter for general adult medical examination without abnormal findings: Secondary | ICD-10-CM | POA: Diagnosis not present

## 2022-12-09 DIAGNOSIS — H2512 Age-related nuclear cataract, left eye: Secondary | ICD-10-CM | POA: Diagnosis not present

## 2022-12-09 DIAGNOSIS — H401131 Primary open-angle glaucoma, bilateral, mild stage: Secondary | ICD-10-CM | POA: Diagnosis not present

## 2023-02-22 DIAGNOSIS — R42 Dizziness and giddiness: Secondary | ICD-10-CM | POA: Diagnosis not present

## 2023-04-01 DIAGNOSIS — R051 Acute cough: Secondary | ICD-10-CM | POA: Diagnosis not present

## 2023-04-01 DIAGNOSIS — B351 Tinea unguium: Secondary | ICD-10-CM | POA: Diagnosis not present

## 2023-06-17 DIAGNOSIS — L601 Onycholysis: Secondary | ICD-10-CM | POA: Diagnosis not present

## 2023-07-07 DIAGNOSIS — H401131 Primary open-angle glaucoma, bilateral, mild stage: Secondary | ICD-10-CM | POA: Diagnosis not present

## 2023-07-07 DIAGNOSIS — H2512 Age-related nuclear cataract, left eye: Secondary | ICD-10-CM | POA: Diagnosis not present

## 2023-11-07 DIAGNOSIS — Z0000 Encounter for general adult medical examination without abnormal findings: Secondary | ICD-10-CM | POA: Diagnosis not present

## 2023-11-07 DIAGNOSIS — Z23 Encounter for immunization: Secondary | ICD-10-CM | POA: Diagnosis not present

## 2023-11-18 DIAGNOSIS — E7800 Pure hypercholesterolemia, unspecified: Secondary | ICD-10-CM | POA: Diagnosis not present

## 2023-11-18 DIAGNOSIS — Z79899 Other long term (current) drug therapy: Secondary | ICD-10-CM | POA: Diagnosis not present

## 2023-11-18 DIAGNOSIS — Z0000 Encounter for general adult medical examination without abnormal findings: Secondary | ICD-10-CM | POA: Diagnosis not present

## 2023-11-18 DIAGNOSIS — E559 Vitamin D deficiency, unspecified: Secondary | ICD-10-CM | POA: Diagnosis not present

## 2023-11-18 DIAGNOSIS — Z23 Encounter for immunization: Secondary | ICD-10-CM | POA: Diagnosis not present

## 2023-11-18 DIAGNOSIS — I480 Paroxysmal atrial fibrillation: Secondary | ICD-10-CM | POA: Diagnosis not present

## 2023-11-18 DIAGNOSIS — R7303 Prediabetes: Secondary | ICD-10-CM | POA: Diagnosis not present

## 2023-11-18 DIAGNOSIS — Z1211 Encounter for screening for malignant neoplasm of colon: Secondary | ICD-10-CM | POA: Diagnosis not present

## 2023-11-18 DIAGNOSIS — R030 Elevated blood-pressure reading, without diagnosis of hypertension: Secondary | ICD-10-CM | POA: Diagnosis not present
